# Patient Record
Sex: Female | Born: 1950 | Race: White | Hispanic: No | Smoking: Never smoker
Health system: Southern US, Community
[De-identification: ages and names within clinical notes are randomized; demographics above are authoritative.]

## PROBLEM LIST (undated history)

## (undated) DIAGNOSIS — G40909 Epilepsy, unspecified, not intractable, without status epilepticus: Secondary | ICD-10-CM

## (undated) DIAGNOSIS — G8929 Other chronic pain: Secondary | ICD-10-CM

## (undated) DIAGNOSIS — J45909 Unspecified asthma, uncomplicated: Secondary | ICD-10-CM

## (undated) DIAGNOSIS — F32A Depression, unspecified: Secondary | ICD-10-CM

## (undated) DIAGNOSIS — M792 Neuralgia and neuritis, unspecified: Secondary | ICD-10-CM

## (undated) DIAGNOSIS — F329 Major depressive disorder, single episode, unspecified: Secondary | ICD-10-CM

## (undated) HISTORY — DX: Unspecified asthma, uncomplicated: J45.909

## (undated) HISTORY — DX: Neuralgia and neuritis, unspecified: M79.2

## (undated) HISTORY — DX: Epilepsy, unspecified, not intractable, without status epilepticus: G40.909

## (undated) HISTORY — DX: Depression, unspecified: F32.A

## (undated) HISTORY — DX: Other chronic pain: G89.29

## (undated) HISTORY — PX: COLONOSCOPY: SHX174

## (undated) HISTORY — DX: Major depressive disorder, single episode, unspecified: F32.9

---

## 1980-07-28 HISTORY — PX: ECTOPIC PREGNANCY SURGERY: SHX613

## 2000-09-30 ENCOUNTER — Other Ambulatory Visit: Admission: RE | Admit: 2000-09-30 | Discharge: 2000-09-30 | Payer: Self-pay | Admitting: Obstetrics & Gynecology

## 2001-11-11 ENCOUNTER — Other Ambulatory Visit: Admission: RE | Admit: 2001-11-11 | Discharge: 2001-11-11 | Payer: Self-pay | Admitting: Obstetrics & Gynecology

## 2002-11-17 ENCOUNTER — Other Ambulatory Visit: Admission: RE | Admit: 2002-11-17 | Discharge: 2002-11-17 | Payer: Self-pay | Admitting: Obstetrics & Gynecology

## 2003-11-27 ENCOUNTER — Other Ambulatory Visit: Admission: RE | Admit: 2003-11-27 | Discharge: 2003-11-27 | Payer: Self-pay | Admitting: Obstetrics & Gynecology

## 2005-01-07 ENCOUNTER — Other Ambulatory Visit: Admission: RE | Admit: 2005-01-07 | Discharge: 2005-01-07 | Payer: Self-pay | Admitting: Obstetrics & Gynecology

## 2010-12-27 ENCOUNTER — Other Ambulatory Visit: Payer: Self-pay | Admitting: Diagnostic Neuroimaging

## 2010-12-27 DIAGNOSIS — R42 Dizziness and giddiness: Secondary | ICD-10-CM

## 2011-01-16 ENCOUNTER — Ambulatory Visit
Admission: RE | Admit: 2011-01-16 | Discharge: 2011-01-16 | Disposition: A | Discharge status: Home / Self Care | Payer: BC Managed Care – PPO | Source: Ambulatory Visit | Attending: Diagnostic Neuroimaging | Admitting: Diagnostic Neuroimaging

## 2011-01-16 DIAGNOSIS — R42 Dizziness and giddiness: Secondary | ICD-10-CM

## 2011-01-16 MED ORDER — GADOBENATE DIMEGLUMINE 529 MG/ML IV SOLN
14.0000 mL | Freq: Once | INTRAVENOUS | Status: AC | PRN
  Administered 2011-01-16: 14 mL via INTRAVENOUS

## 2013-09-26 ENCOUNTER — Encounter: Payer: Self-pay | Admitting: Diagnostic Neuroimaging

## 2013-09-26 ENCOUNTER — Encounter (INDEPENDENT_AMBULATORY_CARE_PROVIDER_SITE_OTHER): Payer: Self-pay

## 2013-09-26 ENCOUNTER — Ambulatory Visit (INDEPENDENT_AMBULATORY_CARE_PROVIDER_SITE_OTHER): Payer: BC Managed Care – PPO | Admitting: Diagnostic Neuroimaging

## 2013-09-26 VITALS — BP 111/72 | HR 74 | Ht 66.5 in | Wt 154.0 lb

## 2013-09-26 DIAGNOSIS — M5481 Occipital neuralgia: Secondary | ICD-10-CM

## 2013-09-26 DIAGNOSIS — M531 Cervicobrachial syndrome: Secondary | ICD-10-CM

## 2013-09-26 NOTE — Patient Instructions (Addendum)
  Occipital Neuralgia Neuralgias are attacks of sharp stabbing pain. They may be intermittent (comes and goes) or constant in nature. They may be brief attacks that last seconds to minutes and may come back for days to weeks.  TYPES OF NEURALGIA  When these pains are located in the back of the head and neck they are called occipital neuralgias. The attacks of pain may come from injury or inflammation (irritation) to a nerve. Often the cause is unknown. The episodes of pain may be caused by light touch, movement, or even eating and sneezing. Usually these neuralgias occur after age 96forty. The neuralgias following shingles and trigeminal neuralgia are the most common. Although painful, these episodes do not threaten life and tend to lessen as we grow older. TREATMENT  There are many medications that may be helpful in the treatment of this disorder. Sometimes several medications may have to be tried before the right combination can be found for you. Some of these medications are:  Only take over-the-counter or prescription medications for pain, discomfort, or fever as directed by your caregiver.  Antidepressants and medications used in epilepsy (seizure disorders) may be useful. LET YOUR CAREGIVER KNOW ABOUT:  If you do not obtain relief from medications.  Problems that are getting worse rather than better.  Troubling side effects that you think are coming from the medication. Do not be discouraged if you do not obtain instant relief from the medications or help given you. Your caregiver can help you get through these episodes of pain with some persistence (continued trying) on your part also. Document Released: 07/08/2001 Document Revised: 10/06/2011 Document Reviewed: 07/14/2005 [facility] Patient Information 2014 [place], [place].

## 2013-09-26 NOTE — Progress Notes (Signed)
[facility]  PATIENT: Leah Mathews DOB: 02-Jun-1951  REFERRING CLINICIAN: D Gates HISTORY FROM: patient  REASON FOR VISIT: new consult (existing patient)   HISTORICAL  CHIEF COMPLAINT:  Chief Complaint  Patient presents with  . Headache    Np # 7    HISTORY OF PRESENT ILLNESS:   New HPI (09/26/13): 63 year old female here for evaluation of intermittent electrical zaps. Patient describes brief electrical charge sensation in the back of her head, mainly on the right side but sometimes on the left. Episodes last just for one second at a time. She has these events sporadically over the last 8-10 years. Several months ago these increased in frequency with more pain. These were occurring every minute at a time. Symptoms have decreased recently. Patient has tried over-the-counter medication without relief. She was also prescribed Tegretol for possible trigeminal neuralgia which did not help.  I previously saw patient in 2012 for a different problem, vertigo ringing in the ears and seizure disorder. These are improved. No further seizures. No further vertigo.  PRIOR HPI (12/16/10): 63 year old right-handed female with history of seizure disorder, anxiety, hearing impairment, shingles, here for evaluation of vertigo, tinnitus and evaluation for seizure disorder.  Age 84 years old - patient had a combination of grand mal seizures, drop attacks, myoclonic jerks, and was treated with Dilantin x 7 years.  She had no further sz once starting Dilantin.  Eventually she was taken off of Dilantin, and she has had no further seizures since then.  April 2011 - developed right-sided "TMJ" pain and problems with cherwing; was seen by a dentist specialists and treated conservatively with heat packs.  Eventually this symptom subsided. Around the same time she developed intermittent positional vertigo (room spinning) with turning her head to the right.  This was also self-limited.  Mar 2012 -  developed well ringing sensation in her ears like a "freight train", and one morning woke up with a "burst blood vessel in her left eye".  Patient reports history of hearing impairment since age 59 years old, and wearing hearing aids for the past 6 years.  REVIEW OF SYSTEMS: Full 14 system review of systems performed and notable only for depression anxiety joint pain and cramps allergies hearing loss ringing in ears spinning sensation.  ALLERGIES: Allergies  Allergen Reactions  . Amitriptyline Other (See Comments)    Depression  . Ciprofloxacin Rash  . Sulfur Rash  . Tetanus Toxoids Rash    HOME MEDICATIONS: No outpatient prescriptions prior to visit.   No facility-administered medications prior to visit.    PAST MEDICAL HISTORY: No past medical history on file.  PAST SURGICAL HISTORY: No past surgical history on file.  FAMILY HISTORY: No family history on file.  SOCIAL HISTORY:  History   Social History  . Marital Status: Legally Separated    Spouse Name: N/A    Number of Children: 2  . Years of Education: college   Occupational History  . [employer]    Social History Main Topics  . Smoking status: Never Smoker   . Smokeless tobacco: Not on file  . Alcohol Use: Yes  . Drug Use: No  . Sexual Activity: Not on file   Other Topics Concern  . Not on file   Social History Narrative  . No narrative on file     PHYSICAL EXAM  Filed Vitals:   09/26/13 1001  BP: 111/72  Pulse: 74  Height: 5' 6.5" (1.689 m)  Weight: 154 lb (69.854 kg)  Not recorded    Body mass index is 24.49 kg/(m^2).  GENERAL EXAM: Patient is in no distress; well developed, nourished and groomed; neck is supple; NO OCCIPITAL NERVE TENDERNESS.  CARDIOVASCULAR: Regular rate and rhythm, no murmurs, no carotid bruits  NEUROLOGIC: MENTAL STATUS: awake, alert, oriented to person, place and time, recent and remote memory intact, normal attention and concentration, language fluent,  comprehension intact, naming intact, fund of knowledge appropriate CRANIAL NERVE: no papilledema on fundoscopic exam, pupils equal and reactive to light, visual fields full to confrontation, extraocular muscles intact, no nystagmus, facial sensation and strength symmetric, hearing intact, palate elevates symmetrically, uvula midline, shoulder shrug symmetric, tongue midline. MOTOR: normal bulk and tone, full strength in the BUE, BLE SENSORY: normal and symmetric to light touch, pinprick, temperature, vibration and proprioception COORDINATION: finger-nose-finger, fine finger movements, heel-shin normal REFLEXES: deep tendon reflexes present and symmetric GAIT/STATION: narrow based gait; able to walk on toes, heels and tandem; romberg is negative    DIAGNOSTIC DATA (LABS, IMAGING, TESTING) - I reviewed patient records, labs, notes, testing and imaging myself where available.  No results found for this basename: WBC, HGB, HCT, MCV, PLT   No results found for this basename: na, k, cl, co2, glucose, bun, creatinine, calcium, prot, albumin, ast, alt, alkphos, bilitot, gfrnonaa, gfraa   No results found for this basename: CHOL, HDL, LDLCALC, LDLDIRECT, TRIG, CHOLHDL   No results found for this basename: HGBA1C   No results found for this basename: VITAMINB12   No results found for this basename: TSH    01/16/11 MRI brain - few non-specific foci of gliosis  01/16/11 MRA head - normal   ASSESSMENT AND PLAN  63 y.o. year old female here with intermittent electrical sensations in the occipital region, consistent with occipital neuralgia. Discussed options for treatment. Patient will monitor symptoms over time. In future may consider occipital nerve block injection versus gabapentin.  Dx: occipital neuralgia  PLAN: - observation as symptoms spontaneously improved; in future may consider occipital nerve block injection versus gabapentin   Meds ordered this encounter  Medications  .  cholecalciferol (VITAMIN D) 1000 UNITS tablet    Sig: Take 1,000 Units by mouth daily.    Return if symptoms worsen or fail to improve.    Suanne MarkerVIKRAM R. Conal Shetley, MD 09/26/2013, 10:53 AM Certified in Neurology, Neurophysiology and Neuroimaging  [facility] [address], [address] [place], [place] [id number] [phone]

## 2014-05-10 ENCOUNTER — Encounter: Payer: Self-pay | Admitting: Diagnostic Neuroimaging

## 2014-05-10 ENCOUNTER — Ambulatory Visit (INDEPENDENT_AMBULATORY_CARE_PROVIDER_SITE_OTHER): Payer: BC Managed Care – PPO | Admitting: Diagnostic Neuroimaging

## 2014-05-10 ENCOUNTER — Encounter (INDEPENDENT_AMBULATORY_CARE_PROVIDER_SITE_OTHER): Payer: Self-pay

## 2014-05-10 VITALS — BP 129/78 | HR 59 | Ht 65.5 in | Wt 153.6 lb

## 2014-05-10 DIAGNOSIS — M5441 Lumbago with sciatica, right side: Secondary | ICD-10-CM

## 2014-05-10 DIAGNOSIS — M79604 Pain in right leg: Secondary | ICD-10-CM

## 2014-05-10 DIAGNOSIS — G2581 Restless legs syndrome: Secondary | ICD-10-CM

## 2014-05-10 DIAGNOSIS — M79605 Pain in left leg: Secondary | ICD-10-CM

## 2014-05-10 NOTE — Progress Notes (Signed)
[facility]  PATIENT: Leah Mathews DOB: 03/19/1951  REFERRING CLINICIAN: D Gates HISTORY FROM: patient  REASON FOR VISIT: new consult (existing patient)   HISTORICAL  CHIEF COMPLAINT:  Chief Complaint  Patient presents with  . Pain    legs    HISTORY OF PRESENT ILLNESS:   NEW HPI (05/10/14): Patient known to me from prior evaluations. However patient presents for new problem consisting of bilateral leg pain, restless legs, numbness. Approximately one year ago patient with onset of cramps, pain, uneasy sensation in the legs. More often she has right hip, right leg, right calf radiating pain, as compared to the left side. Over the past 1 month patient is noted intermittent jerking, uneasy restless sensation in the legs, especially she lays down to sleep at night. Sometimes this wakes her up from sleep. Patient has been taking magnesium supplements last 2 weeks which has significantly improved the symptoms. However she still has pain in the right leg, radiating downward. Patient was evaluated with iron studies, thyroid function testing, CBC which were unremarkable. Patient was prescribed Mirapex, but she has not tried this yet as the magnesium supplement seems to help her symptoms.  PRIOR HPI (09/26/13): 63 year old female here for evaluation of intermittent electrical zaps. Patient describes brief electrical charge sensation in the back of her head, mainly on the right side but sometimes on the left. Episodes last just for one second at a time. She has these events sporadically over the last 8-10 years. Several months ago these increased in frequency with more pain. These were occurring every minute at a time. Symptoms have decreased recently. Patient has tried over-the-counter medication without relief. She was also prescribed Tegretol for possible trigeminal neuralgia which did not help. I previously saw patient in 2012 for a different problem, vertigo ringing in the ears  and seizure disorder. These are improved. No further seizures. No further vertigo.  PRIOR HPI (12/16/10): 63 year old right-handed female with history of seizure disorder, anxiety, hearing impairment, shingles, here for evaluation of vertigo, tinnitus and evaluation for seizure disorder. Age 55 years old - patient had a combination of grand mal seizures, drop attacks, myoclonic jerks, and was treated with Dilantin x 7 years.  She had no further sz once starting Dilantin.  Eventually she was taken off of Dilantin, and she has had no further seizures since then. April 2011 - developed right-sided "TMJ" pain and problems with cherwing; was seen by a dentist specialists and treated conservatively with heat packs.  Eventually this symptom subsided. Around the same time she developed intermittent positional vertigo (room spinning) with turning her head to the right.  This was also self-limited. Mar 2012 - developed well ringing sensation in her ears like a "freight train", and one morning woke up with a "burst blood vessel in her left eye". Patient reports history of hearing impairment since age 19 years old, and wearing hearing aids for the past 6 years.  REVIEW OF SYSTEMS: Full 14 system review of systems performed and notable only for fatigue hearing loss ringing in ears spinning sensation joint pain cramps memory loss restless legs not enough sleep decreased energy disinterest in activities.   ALLERGIES: Allergies  Allergen Reactions  . Amitriptyline Other (See Comments)    Depression  . Ciprofloxacin Rash  . Sulfur Rash  . Tetanus Toxoids Rash    HOME MEDICATIONS: Outpatient Prescriptions Prior to Visit  Medication Sig Dispense Refill  . cholecalciferol (VITAMIN D) 1000 UNITS tablet Take 1,000 Units by mouth daily.  No facility-administered medications prior to visit.    PAST MEDICAL HISTORY: Past Medical History  Diagnosis Date  . Epilepsy   . Depression     PAST SURGICAL  HISTORY: History reviewed. No pertinent past surgical history.  FAMILY HISTORY: Family History  Problem Relation Age of Onset  . Congestive Heart Failure Mother   . Heart disease Father     SOCIAL HISTORY:  History   Social History  . Marital Status: Legally Separated    Spouse Name: N/A    Number of Children: 2  . Years of Education: college   Occupational History  . [employer]    Social History Main Topics  . Smoking status: Never Smoker   . Smokeless tobacco: Never Used  . Alcohol Use: Yes     Comment: occasionally  . Drug Use: No  . Sexual Activity: Not on file   Other Topics Concern  . Not on file   Social History Narrative   Patient lives at home with son.   Caffeine Use: 2 cups daily     PHYSICAL EXAM  Filed Vitals:   05/10/14 0918  BP: 129/78  Pulse: 59  Height: 5' 5.5" (1.664 m)  Weight: 153 lb 9.6 oz (69.673 kg)    Not recorded    Body mass index is 25.16 kg/(m^2).  GENERAL EXAM: Patient is in no distress; well developed, nourished and groomed; neck is supple  CARDIOVASCULAR: Regular rate and rhythm, no murmurs, no carotid bruits  NEUROLOGIC: MENTAL STATUS: awake, alert, oriented to person, place and time, recent and remote memory intact, normal attention and concentration, language fluent, comprehension intact, naming intact, fund of knowledge appropriate CRANIAL NERVE: no papilledema on fundoscopic exam, pupils equal and reactive to light, visual fields full to confrontation, extraocular muscles intact, no nystagmus, facial sensation and strength symmetric, hearing intact, palate elevates symmetrically, uvula midline, shoulder shrug symmetric, tongue midline. MOTOR: normal bulk and tone, full strength in the BUE, BLE SENSORY: normal and symmetric to light touch, pinprick; SLIGHT DECR TEMP IN LEFT FOOT; RIGHT TOE VIB 5 SEC; LEFT TOE VIB 9 SEC COORDINATION: finger-nose-finger, fine finger movements normal REFLEXES: BUE 1, BLE 0 GAIT/STATION:  narrow based gait; able to walk on toes, heels; romberg is negative    DIAGNOSTIC DATA (LABS, IMAGING, TESTING) - I reviewed patient records, labs, notes, testing and imaging myself where available.  No results found for this basename: WBC,  HGB,  HCT,  MCV,  PLT   No results found for this basename: na,  k,  cl,  co2,  glucose,  bun,  creatinine,  calcium,  prot,  albumin,  ast,  alt,  alkphos,  bilitot,  gfrnonaa,  gfraa   No results found for this basename: CHOL,  HDL,  LDLCALC,  LDLDIRECT,  TRIG,  CHOLHDL   No results found for this basename: HGBA1C   No results found for this basename: VITAMINB12   No results found for this basename: TSH    01/16/11 MRI brain - few non-specific foci of gliosis  01/16/11 MRA head - normal     ASSESSMENT AND PLAN  63 y.o. year old female here with a one-year history of lower extremity pain, cramps, restless movements, low back pain radiating to the right leg. Likely represents RLS, but could be related to underlying radiculopathy or neuropathy.  DDx: lumbar radiculopathy vs peripheral neuropathy vs idiopathic restless leg syndrome  PLAN: - observation, as symptoms are slightly improving with magnesium supplements - If symptoms persist or worsen,  then will check addl testing (MRI lumbar spine ,EMG/NCS, B12, A1c, CK, aldolase, ESR, CRP)  Return in about 3 months (around 08/10/2014).    Penni Bombard, MD 50/38/8828, 0:03 AM Certified in Neurology, Neurophysiology and Neuroimaging  [facility] [address] [place], [place] [address] [phone]

## 2014-05-10 NOTE — Patient Instructions (Signed)
Continue magnesium.  Monitor symptoms.  If symptoms are worse, then we may order additional testing.

## 2014-07-26 ENCOUNTER — Ambulatory Visit (INDEPENDENT_AMBULATORY_CARE_PROVIDER_SITE_OTHER): Payer: BC Managed Care – PPO | Admitting: Diagnostic Neuroimaging

## 2014-07-26 ENCOUNTER — Ambulatory Visit: Payer: BC Managed Care – PPO | Admitting: Diagnostic Neuroimaging

## 2014-07-26 ENCOUNTER — Encounter: Payer: Self-pay | Admitting: Diagnostic Neuroimaging

## 2014-07-26 VITALS — BP 130/81 | HR 84 | Ht 64.5 in | Wt 156.8 lb

## 2014-07-26 DIAGNOSIS — M5441 Lumbago with sciatica, right side: Secondary | ICD-10-CM

## 2014-07-26 NOTE — Progress Notes (Signed)
[facility]  PATIENT: Leah Mathews DOB: 02-08-1951  REFERRING CLINICIAN: D Gates HISTORY FROM: patient  REASON FOR VISIT: follow up    HISTORICAL  CHIEF COMPLAINT:  Chief Complaint  Patient presents with  . Follow-up    HISTORY OF PRESENT ILLNESS:   UPDATE 07/26/14: Since last visit, continues to have intermittent pain in bilateral buttocks, radiating to the legs (back of thighs into calves). Symptoms worse with sitting for long time. Some numbness at night time in hands. Has been less active lately. No weakness.   NEW HPI (05/10/14): Patient known to me from prior evaluations. However patient presents for new problem consisting of bilateral leg pain, restless legs, numbness. Approximately one year ago patient with onset of cramps, pain, uneasy sensation in the legs. More often she has right hip, right leg, right calf radiating pain, as compared to the left side. Over the past 1 month patient is noted intermittent jerking, uneasy restless sensation in the legs, especially she lays down to sleep at night. Sometimes this wakes her up from sleep. Patient has been taking magnesium supplements last 2 weeks which has significantly improved the symptoms. However she still has pain in the right leg, radiating downward. Patient was evaluated with iron studies, thyroid function testing, CBC which were unremarkable. Patient was prescribed Mirapex, but she has not tried this yet as the magnesium supplement seems to help her symptoms.  PRIOR HPI (09/26/13): 63 year old female here for evaluation of intermittent electrical zaps. Patient describes brief electrical charge sensation in the back of her head, mainly on the right side but sometimes on the left. Episodes last just for one second at a time. She has these events sporadically over the last 8-10 years. Several months ago these increased in frequency with more pain. These were occurring every minute at a time. Symptoms have  decreased recently. Patient has tried over-the-counter medication without relief. She was also prescribed Tegretol for possible trigeminal neuralgia which did not help. I previously saw patient in 2012 for a different problem, vertigo ringing in the ears and seizure disorder. These are improved. No further seizures. No further vertigo.  PRIOR HPI (12/16/10): 63 year old right-handed female with history of seizure disorder, anxiety, hearing impairment, shingles, here for evaluation of vertigo, tinnitus and evaluation for seizure disorder. Age 55 years old - patient had a combination of grand mal seizures, drop attacks, myoclonic jerks, and was treated with Dilantin x 7 years.  She had no further sz once starting Dilantin.  Eventually she was taken off of Dilantin, and she has had no further seizures since then. April 2011 - developed right-sided "TMJ" pain and problems with cherwing; was seen by a dentist specialists and treated conservatively with heat packs.  Eventually this symptom subsided. Around the same time she developed intermittent positional vertigo (room spinning) with turning her head to the right.  This was also self-limited. Mar 2012 - developed well ringing sensation in her ears like a "freight train", and one morning woke up with a "burst blood vessel in her left eye". Patient reports history of hearing impairment since age 39 years old, and wearing hearing aids for the past 6 years.   REVIEW OF SYSTEMS: Full 14 system review of systems performed and notable only for fatigue hearing loss ringing ears light sens restless legs freq waking muscle cramps decr concentration numbness memory loss dizziness joint pain.    ALLERGIES: Allergies  Allergen Reactions  . Amitriptyline Other (See Comments)    Depression  .  Ciprofloxacin Rash  . Sulfur Rash  . Tetanus Toxoids Rash    HOME MEDICATIONS: Outpatient Prescriptions Prior to Visit  Medication Sig Dispense Refill  .  acetaminophen-codeine (TYLENOL #3) 300-30 MG per tablet Take 1 tablet by mouth every 6 (six) hours as needed.    . ALPRAZolam (XANAX) 0.25 MG tablet Take 1 tablet by mouth as needed.    . Calcium Carbonate-Vit D-Min (CALCIUM 1200 PO) Take 1 tablet by mouth daily.    . cholecalciferol (VITAMIN D) 1000 UNITS tablet Take 1,000 Units by mouth daily.    . Magnesium 200 MG TABS Take 1 tablet by mouth daily.    . pramipexole (MIRAPEX) 0.125 MG tablet Take 1 tablet by mouth daily.     No facility-administered medications prior to visit.    PAST MEDICAL HISTORY: Past Medical History  Diagnosis Date  . Epilepsy   . Depression     PAST SURGICAL HISTORY: History reviewed. No pertinent past surgical history.  FAMILY HISTORY: Family History  Problem Relation Age of Onset  . Congestive Heart Failure Mother   . Heart disease Father     SOCIAL HISTORY:  History   Social History  . Marital Status: Legally Separated    Spouse Name: N/A    Number of Children: 2  . Years of Education: college   Occupational History  . [employer]    Social History Main Topics  . Smoking status: Never Smoker   . Smokeless tobacco: Never Used  . Alcohol Use: Yes     Comment: occasionally  . Drug Use: No  . Sexual Activity: Not on file   Other Topics Concern  . Not on file   Social History Narrative   Patient lives at home with son.   Caffeine Use: 2 cups daily     PHYSICAL EXAM  Filed Vitals:   07/26/14 1538  BP: 130/81  Pulse: 84  Height: 5' 4.5" (1.638 m)  Weight: 156 lb 12.8 oz (71.124 kg)    Not recorded      Body mass index is 26.51 kg/(m^2).  GENERAL EXAM: Patient is in no distress; well developed, nourished and groomed; neck is supple  CARDIOVASCULAR: Regular rate and rhythm, no murmurs, no carotid bruits  NEUROLOGIC: MENTAL STATUS: awake, alert, language fluent, comprehension intact, naming intact, fund of knowledge appropriate CRANIAL NERVE: pupils equal and reactive to  light, visual fields full to confrontation, extraocular muscles intact, no nystagmus, facial sensation and strength symmetric, hearing intact, palate elevates symmetrically, uvula midline, shoulder shrug symmetric, tongue midline. MOTOR: normal bulk and tone, full strength in the BUE, BLE SENSORY: normal and symmetric to light touch; STRAIGHT LEG RAISE NEG; DF OF FEET TRIGGERS PAIN IN CALVES AND HAMSTRINGS COORDINATION: finger-nose-finger, fine finger movements normal REFLEXES: BUE 1, BLE TRACE  GAIT/STATION: narrow based gait; romberg is negative    DIAGNOSTIC DATA (LABS, IMAGING, TESTING) - I reviewed patient records, labs, notes, testing and imaging myself where available.  No results found for: WBC No results found for: NA No results found for: CHOL No results found for: HGBA1C No results found for: VITAMINB12 No results found for: TSH  01/16/11 MRI brain - few non-specific foci of gliosis  01/16/11 MRA head - normal     ASSESSMENT AND PLAN  63 y.o. year old female here with a intermittent lower extremity pain, cramps, restless movements, low back pain radiating to the right leg. May represent lumbar radiculopathy or neuropathy. Since symptoms are mild and intermittent, advised conservative management.  DDx: lumbar radiculopathy vs peripheral neuropathy vs idiopathic restless leg syndrome  PLAN: - PT evaluation - consider massage therapy, yoga, water therapy/swimming - If symptoms persist or worsen, then may consider addl testing (MRI lumbar spine, EMG/NCS, B12, A1c, CK, aldolase, ESR, CRP)  Return in about 4 months (around 11/25/2014).    Penni Bombard, MD 73/40/3709, 6:43 PM Certified in Neurology, Neurophysiology and Neuroimaging  [facility] [address], [place], [place] [address] [phone]

## 2014-07-26 NOTE — Patient Instructions (Signed)
-   physical therapy evaluation - consider massage therapy, yoga, water therapy/swimming

## 2014-08-14 ENCOUNTER — Ambulatory Visit: Payer: BC Managed Care – PPO | Admitting: Diagnostic Neuroimaging

## 2014-08-21 ENCOUNTER — Ambulatory Visit: Payer: BC Managed Care – PPO | Admitting: Occupational Therapy

## 2014-08-21 ENCOUNTER — Ambulatory Visit: Payer: BC Managed Care – PPO | Admitting: Physical Therapy

## 2014-08-31 ENCOUNTER — Ambulatory Visit: Payer: BC Managed Care – PPO | Attending: Diagnostic Neuroimaging

## 2014-08-31 DIAGNOSIS — M791 Myalgia: Secondary | ICD-10-CM | POA: Diagnosis present

## 2014-08-31 DIAGNOSIS — M5441 Lumbago with sciatica, right side: Secondary | ICD-10-CM | POA: Diagnosis not present

## 2014-08-31 DIAGNOSIS — M7918 Myalgia, other site: Secondary | ICD-10-CM

## 2014-08-31 DIAGNOSIS — M5430 Sciatica, unspecified side: Secondary | ICD-10-CM

## 2014-08-31 DIAGNOSIS — G40909 Epilepsy, unspecified, not intractable, without status epilepticus: Secondary | ICD-10-CM | POA: Insufficient documentation

## 2014-08-31 NOTE — Therapy (Signed)
[facility] [address] [place], [place], [address] Phone: [phone]   Fax:  [phone]  Physical Therapy Evaluation  Patient Details  Name: Leah Mathews MRN: [id number] Date of Birth: 15-Jan-1951 Referring Provider:  Hollice Espy, MD  Encounter Date: 08/31/2014      PT End of Session - 08/31/14 1155    Visit Number 1   Number of Visits 17   Date for PT Re-Evaluation 10/30/14   Authorization Type BCBS   PT Start Time [phone]   PT Stop Time 1013   PT Time Calculation (min) 42 min   Activity Tolerance Patient tolerated treatment well   Behavior During Therapy [facility] for tasks assessed/performed      Past Medical History  Diagnosis Date  . Epilepsy   . Depression     History reviewed. No pertinent past surgical history.  There were no vitals taken for this visit.  Visit Diagnosis:  Bilateral buttock pain - Plan: PT plan of care cert/re-cert  Sciatic leg pain - B LE pain - Plan: PT plan of care cert/re-cert      Subjective Assessment - 08/31/14 0940    Symptoms B buttocks and LE pain, difficulty lifting legs after sitting/lying down, N/T in B LEs, pain occasionally wakes pt while sleeping at night however, she is unsure if this is due to restless leg syndrome   Pertinent History Self reported osteoporosis, chronic B knee pain, seizures   Limitations Sitting;Standing   How long can you sit comfortably? One hour   How long can you stand comfortably? 30 minutes   Patient Stated Goals Sleep through the night without pain/tossing and turning, reduce pain while sitting, dance without pain, more energy in order to clean house   Currently in Pain? No/denies  Pt reported pain in B buttocks/LEs can incr. to 7-8/10 at its worse and described it as achy and dull with occasional sharp/electrical sensation in B LEs (R worse than L).          [facility] PT Assessment - 08/31/14 0946    Assessment   Medical Diagnosis Low  back pain with R sided leg pain   Onset Date 07/28/13   Prior Therapy PT for low back, approx. 30 years ago.   Precautions   Precautions None   Restrictions   Weight Bearing Restrictions No   Balance Screen   Has the patient fallen in the past 6 months No   Has the patient had a decrease in activity level because of a fear of falling?  No  However, pt is fearful of falling on steps while walking dog   Is the patient reluctant to leave their home because of a fear of falling?  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   Coordination   Gross Motor Movements are Fluid and Coordinated Yes   Fine Motor Movements are Fluid and Coordinated Yes   Functional Tests   Functional tests --  pt reported bending/lifting can incr. pain in buttocks.   Posture/Postural Control   Posture/Postural Control Postural limitations   Postural Limitations Posterior pelvic tilt   AROM   Overall AROM   Deficits   Overall AROM Comments WFL except for decreased B hip IR.   Strength   Overall Strength Deficits   Overall Strength Comments B knee ext: 5/5, B knee flex, hip flexion, dorsiflexion: 4/5 with no reproduction of pain. B hip abduction: 3+/5.   Flexibility   Soft Tissue Assessment /Muscle Lenght yes   Hamstrings B PROM: pt lacking approx. 5 degrees of knee extension with hips at 90 degrees while in supine.   Piriformis Decreased flexibility in B piriformis.   Palpation   Palpation Tenderness and reproduction of pain with palpation of B proximal hamstring attachment (at ischial tuberosities) and along entire B piriformis.   Special Tests    Special Tests Hip Special Tests;Lumbar   Hip Special Tests  Hip Scouring   Straight Leg Raise   Findings Negative   Side  Left   Comment Passive SLR negative, Active left SLR reproduced symptoms in R hip indicating poor load transfer.   other   Findings Negative   Side  Left   Comments Pain not reproduced during back extension, flexion or sidebending.   Hip Scouring   Findings Negative   Side Right;Left   Transfers   Transfers Sit to Stand;Stand to Sit   Sit to Stand 6: Modified independent (Device/Increase time);With armrests;From chair/3-in-1;With upper extremity assist  increased time   Stand to Sit 6: Modified independent (Device/Increase time)   Ambulation/Gait   Ambulation/Gait Yes   Ambulation/Gait Assistance 5: Supervision   Ambulation/Gait Assistance Details Pt ambulated over even terrain.   Ambulation Distance (Feet) 100 Feet   Assistive device None   Gait Pattern Step-through pattern;Decreased trunk rotation  decreased hip rotation, pt guarded while ambulating   Ambulation Surface Level;Indoor                          PT Education - 08/31/14 1154    Education provided Yes   Education Details PT educated pt on using pillows under knees/between knees to improve comfort while sleeping. PT also educated pt  on piriformis muscle and how decreased flexibility can cause compression of sciatic nerve, which can result in B buttock and LE pain.   Person(s) Educated Patient   Methods Explanation   Comprehension Verbalized understanding          PT Short Term Goals - 08/31/14 1200    PT SHORT TERM GOAL #1   Title Pt will be independent in HEP to reduce pain, improve flexibility/strength, and to improve functional mobility. Target date: 09/28/14.   Status New   PT SHORT TERM GOAL #2   Title Pt will report decrease in B buttock/LE pain to </=5/10, while sitting for >60 minutes, in order to perform work duties. Target date: 09/28/14.   Status New   PT  SHORT TERM GOAL #3   Title Perform balance and DGI assessment and write appropriate goal. Target date: 09/28/14.   Status New   PT SHORT TERM GOAL #4   Title Pt will improve B hamstring flexibility to full knee extension with hip in 90 degree position while lying in supine, in order to decrease pain. Target date: 09/28/14.   Status New   PT SHORT TERM GOAL #5   Title Pt will report she is able to go dancing with friends without increase in B buttock/LE pain, in order to improve quality of life. Target date: 09/28/14.   Status New           PT Long Term Goals - 08/31/14 1203    PT LONG TERM GOAL #1   Title Pt will verbalize plans to join a fitness center, or take dance lessons, to maintain gains made in PT and to improve quality of life. Target date: 10/26/14.   Status New   PT LONG TERM GOAL #2   Title Pt will improve ODI score by 12 points to improve quality of life. Target date: 10/26/14.   Status New   PT LONG TERM GOAL #3   Title Pt will report decrease in pain to </=3/10 when sitting >60 minutes to perform work tasks. Target date: 10/26/14.   Status New   PT LONG TERM GOAL #4   Title Pt will be able to ascend/descend 4 steps while carrying 20 pounds, independently, with LOB in order to walk dogs safely and without a fear of falling. Target date:  10/26/14.   Status New               Plan - 08/31/14 [id number]    Clinical Impression Statement Pt is 64 y/o female presenting to OPPT neuro with B buttock pain, which radiates into B legs (R side pain worse than L side). Pain began approx. 1 year ago, and has become progressively worse. B LE pain radiates distally into posterior LE, but does not pass mid gastroc area. Pt states the pain is intermittent and is worse during prolonged sitting. Pt also has history of restless leg syndrome, which has improved with medication. Pt denied falls in the last six months, however, she is fearful of falling down steps when walking her 2 dogs. P   Pt will benefit from skilled therapeutic intervention in order to improve on the following deficits Abnormal gait;Impaired flexibility;Decreased endurance;Pain;Decreased balance;Decreased mobility;Decreased strength   Rehab Potential Good   PT Frequency 2x / week   PT Duration 8 weeks   PT Treatment/Interventions ADLs/Self Care Home Management;Gait training;Neuromuscular re-education;Ultrasound;Stair training;Biofeedback;Functional mobility training;Patient/family education;Therapeutic activities;Cryotherapy;Therapeutic exercise;Manual techniques;Balance training;DME Instruction  No e-stim, as pt has history of seizures.   PT Next Visit Plan Assess balance. Provide pt with hamstring/piriformis stretching program and strengthening program. Manual therapy as needed.   Consulted and Agree with Plan of Care Patient         Problem List There are no active problems to display for this patient.   Alvie Fowles L 08/31/2014, 12:14 PM  [facility] [address] [place], [place], [address] Phone: [phone]   Fax:  [phone]    Zerita BoersJennifer Marena Witts, PT,DPT 08/31/2014 12:14 PM Phone: [phone] Fax: [phone]

## 2014-09-04 ENCOUNTER — Ambulatory Visit: Payer: BC Managed Care – PPO

## 2014-09-04 DIAGNOSIS — M7918 Myalgia, other site: Secondary | ICD-10-CM

## 2014-09-04 DIAGNOSIS — M5430 Sciatica, unspecified side: Secondary | ICD-10-CM

## 2014-09-04 DIAGNOSIS — M791 Myalgia: Secondary | ICD-10-CM | POA: Diagnosis not present

## 2014-09-04 NOTE — Therapy (Signed)
[facility] [address] [place], [place], [address] Phone: [phone]   Fax:  [phone]  Physical Therapy Treatment  Patient Details  Name: Leah Mathews MRN: [id number] Date of Birth: 12/16/1950 Referring Provider:  Hollice EspyGates, Donna Ruth, MD  Encounter Date: 09/04/2014      PT End of Session - 09/04/14 0939    Visit Number 2   Number of Visits 17   Date for PT Re-Evaluation 10/30/14   Authorization Type BCBS   PT Start Time [phone]   PT Stop Time 0932   PT Time Calculation (min) 40 min      Past Medical History  Diagnosis Date  . Epilepsy   . Depression     History reviewed. No pertinent past surgical history.  There were no vitals taken for this visit.  Visit Diagnosis:  Bilateral buttock pain  Sciatic leg pain      Subjective Assessment - 09/04/14 0854    Symptoms Pt arrived 7 minutes late to session. Pt reported low back pain on Friday and Saturday but none today.    Pertinent History Self reported osteoporosis, chronic B knee pain, seizures   Limitations Sitting;Standing   How long can you sit comfortably? One hour   How long can you stand comfortably? 30 minutes   Patient Stated Goals Sleep through the night without pain/tossing and turning, reduce pain while sitting, dance without pain, more energy in order to clean house   Currently in Pain? No/denies                    [facility] Adult PT Treatment/Exercise - 09/04/14 0855    Standardized Balance Assessment   Standardized Balance Assessment Dynamic Gait Index   Dynamic Gait Index   Level Surface Normal   Change in Gait Speed Normal   Gait with Horizontal Head Turns Mild Impairment   Gait with Vertical Head Turns Mild Impairment   Gait and Pivot Turn Normal   Step Over Obstacle Mild Impairment   Step Around Obstacles Normal   Steps Mild Impairment   Total Score 20   Exercises   Exercises Knee/Hip;Lumbar   Lumbar Exercises:  Stretches   Lower Trunk Rotation 1 rep;20 seconds   Lower Trunk Rotation Limitations B LEs, pt reported groin pain during stretch, so exercise ceased.   Knee/Hip Exercises: Stretches   Active Hamstring Stretch 3 reps;30 seconds   Active Hamstring Stretch Limitations B LE, with cues for technique. Pt performed with heel on floor and on chair, she reported heel on floor being more comfortable. Each stretch performed 3 reps each.   Piriformis Stretch 3 reps;30 seconds   Piriformis Stretch Limitations B LE; pt performed knee to contralat. shoulder stretch and ER (ankle to contralat. knee with hip flexion) piriformis stretch. Pt reported she felt a "better" stretch with knee to shoulder stretch. VC's for technique. Each stretch perform 3 reps each.                PT Education - 09/04/14 [id number]    Education provided Yes   Education Details Stretching HEP, PT also explained the importance of obtaining full ROM in order for strength training to be effective.   Person(s) Educated Patient   Methods Explanation;Demonstration;Verbal cues;Tactile cues;Handout   Comprehension Verbalized understanding;Returned demonstration          PT Short Term Goals - 09/04/14 1144    PT SHORT TERM GOAL #1   Title Pt will be independent in HEP  to reduce pain, improve flexibility/strength, and to improve functional mobility. Target date: 09/28/14.   Status On-going   PT SHORT TERM GOAL #2   Title Pt will report decrease in B buttock/LE pain to </=5/10, while sitting for >60 minutes, in order to perform work duties. Target date: 09/28/14.   Status On-going   PT SHORT TERM GOAL #3   Title Perform balance and DGI assessment and write appropriate goal. Target date: 09/28/14.   Baseline Pt scored 20/24, DGI goal not appropriate as pt not at risk for falls.   Status Achieved   PT SHORT TERM GOAL #4   Title Pt will improve B hamstring flexibility to full knee extension with hip in 90 degree position while lying in  supine, in order to decrease pain. Target date: 09/28/14.   Status On-going   PT SHORT TERM GOAL #5   Title Pt will report she is able to go dancing with friends without increase in B buttock/LE pain, in order to improve quality of life. Target date: 09/28/14.   Status On-going           PT Long Term Goals - 09/04/14 1146    PT LONG TERM GOAL #1   Title Pt will verbalize plans to join a fitness center, or take dance lessons, to maintain gains made in PT and to improve quality of life. Target date: 10/26/14.   Status On-going   PT LONG TERM GOAL #2   Title Pt will improve ODI score by 12 points to improve quality of life. Target date: 10/26/14.   Status On-going   PT LONG TERM GOAL #3   Title Pt will report decrease in pain to </=3/10 when sitting >60 minutes to perform work tasks. Target date: 10/26/14.   Status On-going   PT LONG TERM GOAL #4   Title Pt will be able to ascend/descend 4 steps while carrying 20 pounds, independently, with LOB in order to walk dogs safely and without a fear of falling. Target date: 10/26/14.   Status On-going               Plan - 09/04/14 4782    Clinical Impression Statement Pt scored 20/24 on DGI, indicating she is a low risk for falls. However, pt did experience LOB during head turns (self corrected by stepping strategy), and required increased time to step over obstacle and required handrails on steps. I believe this is due to increased hip adduction while descending stairs and narrowed BOS during head turns. MMT B hip abductors: 3/5 and B hip extensors: 3+/5, and B hip external rotators: 3+/5. Step-down: increased hip adduction and increased hip instability noted. Pt would continue to benefit from skilled PT to improve strength, flexibility, and safety during functional mobility.   Pt will benefit from skilled therapeutic intervention in order to improve on the following deficits Abnormal gait;Impaired flexibility;Decreased endurance;Pain;Decreased  balance;Decreased mobility;Decreased strength   Rehab Potential Good   PT Frequency 2x / week   PT Duration 8 weeks   PT Treatment/Interventions ADLs/Self Care Home Management;Gait training;Neuromuscular re-education;Ultrasound;Stair training;Biofeedback;Functional mobility training;Patient/family education;Therapeutic activities;Cryotherapy;Therapeutic exercise;Manual techniques;Balance training;DME Instruction  No e-stim; history of seizures.   PT Next Visit Plan Sidelying clamshells and bridges with theraband around knees to promote hip abductor activation. Review HEP. Pelvic tilts to decr. Posterior pelvic tilt. And manual therapy as needed.   Consulted and Agree with Plan of Care Patient        Problem List There are no active problems to display for  this patient.   Blythe Veach L 09/04/2014, 11:47 AM  [facility] [address] [place], [place], [address] Phone: [phone]   Fax:  [phone]     Zerita Boers, PT,DPT 09/04/2014 11:47 AM Phone: [phone] Fax: [phone]

## 2014-09-04 NOTE — Patient Instructions (Signed)
Chair Sitting   Sit at edge of seat, spine straight, one leg extended. Bend forward from the hip, keeping spine straight. Support upper body with arms. Hold 30___ seconds. Repeat _3__ times per session, with each leg. Do _3__ sessions per day.  Copyright  VHI. All rights reserved.  Piriformis Stretch - Supine   Pull knee across body toward opposite shoulder. Hold slight stretch for _30__ seconds. Repeat with other leg. Repeat _3__ times, with each leg. Do __3_ times per day.  Copyright  VHI. All rights reserved.

## 2014-09-06 ENCOUNTER — Ambulatory Visit: Payer: BC Managed Care – PPO

## 2014-09-06 DIAGNOSIS — M791 Myalgia: Secondary | ICD-10-CM | POA: Diagnosis not present

## 2014-09-06 DIAGNOSIS — M7918 Myalgia, other site: Secondary | ICD-10-CM

## 2014-09-06 DIAGNOSIS — M5430 Sciatica, unspecified side: Secondary | ICD-10-CM

## 2014-09-06 NOTE — Patient Instructions (Signed)
Piriformis Stretch, Supine   Lie supine, one ankle crossed onto opposite knee. Holding bottom leg behind knee, gently pull legs toward chest until stretch is felt in buttock of top leg. Hold _20-30__ seconds. For deeper stretch gently push top knee away from body.  Repeat _3__ times per session. Do __2-3_ sessions per day.  Copyright  VHI. All rights reserved.

## 2014-09-06 NOTE — Therapy (Signed)
[facility] [address] [place], [place], [address] Phone: [phone]   Fax:  [phone]  Physical Therapy Treatment  Patient Details  Name: Leah Mathews MRN: [id number] Date of Birth: 07-05-1951 Referring Provider:  Hollice Espy, MD  Encounter Date: 09/06/2014      PT End of Session - 09/06/14 1238    Visit Number 3   Number of Visits 17   Date for PT Re-Evaluation 10/30/14   Authorization Type BCBS   PT Start Time 0850   PT Stop Time 0928   PT Time Calculation (min) 38 min   Activity Tolerance Patient tolerated treatment well   Behavior During Therapy [facility] for tasks assessed/performed      Past Medical History  Diagnosis Date  . Epilepsy   . Depression     History reviewed. No pertinent past surgical history.  There were no vitals taken for this visit.  Visit Diagnosis:  Bilateral buttock pain  Sciatic leg pain      Subjective Assessment - 09/06/14 0852    Symptoms Pt reported she woke up with B LE/knee pain this morning at 5am, and it decreased to 0/10 with Motrin after approx. 1 hour.  Pt denied falls since last visit.   Pertinent History Self reported osteoporosis, chronic B knee pain, seizures   Limitations Sitting;Standing   How long can you sit comfortably? One hour   How long can you stand comfortably? 30 minutes   Patient Stated Goals Sleep through the night without pain/tossing and turning, reduce pain while sitting, dance without pain, more energy in order to clean house   Currently in Pain? No/denies      Manual therapy: PT performed massage to B glut med/max and piriformis muscles. PT also performed ischemic compression to release trigger points in piriformis and glut med. Pt reported decreased in tension after manual therapy.  PT performed B supine piriformis and hamstring stretches; 2x30 second holds.                       PT Education - 09/06/14 1236    Education provided Yes   Education Details Reviewed stretching HEP and provided pt with additional piriformis stretch, as pt reported she was "got a better stretch with ankle crossed over knee now". PT encouraged pt to increase activity (walking), starting in increments of 5 minutes, and increasing as tolerated.  PT also educated pt that movement is not bad for pt, as she is fearful that movement will increase pain. PT educated pt on log rolling to decrease strain on low back.   Person(s) Educated Patient   Methods Explanation;Demonstration;Handout   Comprehension Verbalized understanding;Returned demonstration;Need further instruction          PT Short Term Goals - 09/04/14 1144    PT SHORT TERM GOAL #1   Title Pt will be independent in HEP to reduce pain, improve flexibility/strength, and to improve functional mobility. Target date: 09/28/14.   Status On-going   PT SHORT TERM GOAL #2   Title Pt will report decrease in B buttock/LE pain to </=5/10, while sitting for >60 minutes, in order to perform work duties. Target date: 09/28/14.   Status On-going   PT SHORT TERM GOAL #3   Title Perform balance and DGI assessment and write appropriate goal. Target date: 09/28/14.   Baseline Pt scored 20/24, DGI goal not appropriate as pt not at risk for falls.   Status Achieved   PT SHORT  TERM GOAL #4   Title Pt will improve B hamstring flexibility to full knee extension with hip in 90 degree position while lying in supine, in order to decrease pain. Target date: 09/28/14.   Status On-going   PT SHORT TERM GOAL #5   Title Pt will report she is able to go dancing with friends without increase in B buttock/LE pain, in order to improve quality of life. Target date: 09/28/14.   Status On-going           PT Long Term Goals - 09/04/14 1146    PT LONG TERM GOAL #1   Title Pt will verbalize plans to join a fitness center, or take dance lessons, to maintain gains made in PT and to improve quality of life. Target  date: 10/26/14.   Status On-going   PT LONG TERM GOAL #2   Title Pt will improve ODI score by 12 points to improve quality of life. Target date: 10/26/14.   Status On-going   PT LONG TERM GOAL #3   Title Pt will report decrease in pain to </=3/10 when sitting >60 minutes to perform work tasks. Target date: 10/26/14.   Status On-going   PT LONG TERM GOAL #4   Title Pt will be able to ascend/descend 4 steps while carrying 20 pounds, independently, with LOB in order to walk dogs safely and without a fear of falling. Target date: 10/26/14.   Status On-going               Plan - 09/06/14 1238    Clinical Impression Statement Pt continues to experience B hip/LE pain, and is fearful that movement increases pain. Pt reported decrease tension in B hip muscles after manual therapy. Pt would continue to benefit from skilled PT to decrease pain during functional mobility.   Pt will benefit from skilled therapeutic intervention in order to improve on the following deficits Abnormal gait;Impaired flexibility;Decreased endurance;Pain;Decreased balance;Decreased mobility;Decreased strength   Rehab Potential Good   PT Frequency 2x / week   PT Duration 8 weeks   PT Treatment/Interventions ADLs/Self Care Home Management;Gait training;Neuromuscular re-education;Ultrasound;Stair training;Biofeedback;Functional mobility training;Patient/family education;Therapeutic activities;Cryotherapy;Therapeutic exercise;Manual techniques;Balance training;DME Instruction  No e-stim due to history of seizures.   PT Next Visit Plan Pelvic tilts. Sidelying clamshells and bridges with theraband around knees to promote hip abductor activation. Manual therapy as needed. Perform FGA to assess dynamic gait.   Consulted and Agree with Plan of Care Patient        Problem List There are no active problems to display for this patient.   Rena Hunke L 09/06/2014, 12:43 PM  [facility] [address] [place], [place], [address] Phone: [phone]   Fax:  [phone]     Zerita BoersJennifer Kadrian Partch, PT,DPT 09/06/2014 12:44 PM Phone: [phone] Fax: [phone]

## 2014-09-11 ENCOUNTER — Ambulatory Visit: Payer: BC Managed Care – PPO

## 2014-09-13 ENCOUNTER — Ambulatory Visit: Payer: BC Managed Care – PPO | Admitting: Physical Therapy

## 2014-09-13 ENCOUNTER — Encounter: Payer: Self-pay | Admitting: Physical Therapy

## 2014-09-13 DIAGNOSIS — M791 Myalgia: Secondary | ICD-10-CM | POA: Diagnosis not present

## 2014-09-13 DIAGNOSIS — M7918 Myalgia, other site: Secondary | ICD-10-CM

## 2014-09-13 DIAGNOSIS — M5430 Sciatica, unspecified side: Secondary | ICD-10-CM

## 2014-09-13 NOTE — Patient Instructions (Addendum)
PELVIC STABILIZATION: Pelvic Tilt (Lying)   Exhaling, pull belly toward spine, tilting pelvis forward. Inhaling, release. Repeat 10 times. Do 2 times per day.  Copyright  VHI. All rights reserved.  PELVIC STABILIZATION: Basic Bridge   Exhaling, lift hips. Hold for 5 breaths. Exhaling, release hips back to floor. Repeat 10 times. Do 2 times per day.  Copyright  VHI. All rights reserved.  Abduction: Clam (Eccentric) - Side-Lying   Lie on side with knees bent. Lift top knee, keeping feet together. Keep trunk steady. Slowly lower for 3-5 seconds. 10 reps per set, 2 sets per day.  Copyright  VHI. All rights reserved.

## 2014-09-13 NOTE — Therapy (Signed)
[facility] [address] [place], [place], [address] Phone: [phone]   Fax:  [phone]  Physical Therapy Treatment  Patient Details  Name: Leah Mathews MRN: [id number] Date of Birth: [birth year] Referring Provider:  Hollice Espy, MD  Encounter Date: 09/13/2014      PT End of Session - 09/13/14 1056    Visit Number 4   Number of Visits 17   Date for PT Re-Evaluation 10/30/14   Authorization Type BCBS   PT Start Time 0848   PT Stop Time 0932   PT Time Calculation (min) 44 min   Activity Tolerance Patient tolerated treatment well   Behavior During Therapy [facility] for tasks assessed/performed      Past Medical History  Diagnosis Date  . Epilepsy   . Depression     History reviewed. No pertinent past surgical history.  There were no vitals taken for this visit.  Visit Diagnosis:  Bilateral buttock pain  Sciatic leg pain      Subjective Assessment - 09/13/14 0851    Symptoms "Can the exercises cause pain?"  Pt reports slip on ice but no fall.   Currently in Pain? Yes   Pain Score 5    Pain Location Back   Pain Orientation Right;Lower   Pain Descriptors / Indicators Dull   Pain Type Chronic pain   Pain Onset More than a month ago   Pain Frequency Constant  "woke up with it"   Pain Relieving Factors tylenol helps sometimes   Multiple Pain Sites No          [facility] PT Assessment - 09/13/14 1048    Functional Gait  Assessment   Gait assessed  Yes   Gait Level Surface Walks 20 ft in less than 5.5 sec, no assistive devices, good speed, no evidence for imbalance, normal gait pattern, deviates no more than 6 in outside of the 12 in walkway width.   Change in Gait Speed Able to smoothly change walking speed without loss of balance or gait deviation. Deviate no more than 6 in outside of the 12 in walkway width.   Gait with Horizontal Head Turns Performs head turns smoothly with no change in gait.  Deviates no more than 6 in outside 12 in walkway width   Gait with Vertical Head Turns Performs head turns with no change in gait. Deviates no more than 6 in outside 12 in walkway width.   Gait and Pivot Turn Pivot turns safely within 3 sec and stops quickly with no loss of balance.   Step Over Obstacle Is able to step over 2 stacked shoe boxes taped together (9 in total height) without changing gait speed. No evidence of imbalance.   Gait with Narrow Base of Support Ambulates 4-7 steps.   Gait with Eyes Closed Walks 20 ft, no assistive devices, good speed, no evidence of imbalance, normal gait pattern, deviates no more than 6 in outside 12 in walkway width. Ambulates 20 ft in less than 7 sec.   Ambulating Backwards Walks 20 ft, no assistive devices, good speed, no evidence for imbalance, normal gait   Steps Alternating feet, no rail.   Total Score 28                  [facility] Adult PT Treatment/Exercise - 09/13/14 1048    Posture/Postural Control   Posture/Postural Control Postural limitations   Postural Limitations Posterior pelvic tilt   Lumbar Exercises: Stretches   Active Hamstring Stretch  1 rep;20 seconds   Active Hamstring Stretch Limitations seated edge of mat-needed cues on degree of stretch   Single Knee to Chest Stretch 2 reps;10 seconds   Single Knee to Chest Stretch Limitations Pt c/o L hip flexor cramp with L knee to ches   Lumbar Exercises: Seated   Other Seated Lumbar Exercises pelvic tilt with PTA assisting/tactile cues for correct movement x 15   Lumbar Exercises: Supine   Bridge 10 reps;3 seconds   Bridge Limitations poor hip extension   Lumbar Exercises: Sidelying   Clam 10 reps;3 seconds   Clam Limitations Pt c/o pain with yellow theraband for resistance therefore performed without resistance                PT Education - 09/13/14 1055    Education provided Yes   Education Details Review hamstring stretch.  Added to HEP.  PT also continued to  educate that movement not bad for pt.  Discussed importance of gentle stretch with exercises and should not cause "pain"   Person(s) Educated Patient   Methods Explanation;Demonstration;Handout   Comprehension Verbalized understanding;Need further instruction          PT Short Term Goals - 09/04/14 1144    PT SHORT TERM GOAL #1   Title Pt will be independent in HEP to reduce pain, improve flexibility/strength, and to improve functional mobility. Target date: 09/28/14.   Status On-going   PT SHORT TERM GOAL #2   Title Pt will report decrease in B buttock/LE pain to </=5/10, while sitting for >60 minutes, in order to perform work duties. Target date: 09/28/14.   Status On-going   PT SHORT TERM GOAL #3   Title Perform balance and DGI assessment and write appropriate goal. Target date: 09/28/14.   Baseline Pt scored 20/24, DGI goal not appropriate as pt not at risk for falls.   Status Achieved   PT SHORT TERM GOAL #4   Title Pt will improve B hamstring flexibility to full knee extension with hip in 90 degree position while lying in supine, in order to decrease pain. Target date: 09/28/14.   Status On-going   PT SHORT TERM GOAL #5   Title Pt will report she is able to go dancing with friends without increase in B buttock/LE pain, in order to improve quality of life. Target date: 09/28/14.   Status On-going           PT Long Term Goals - 09/04/14 1146    PT LONG TERM GOAL #1   Title Pt will verbalize plans to join a fitness center, or take dance lessons, to maintain gains made in PT and to improve quality of life. Target date: 10/26/14.   Status On-going   PT LONG TERM GOAL #2   Title Pt will improve ODI score by 12 points to improve quality of life. Target date: 10/26/14.   Status On-going   PT LONG TERM GOAL #3   Title Pt will report decrease in pain to </=3/10 when sitting >60 minutes to perform work tasks. Target date: 10/26/14.   Status On-going   PT LONG TERM GOAL #4   Title Pt will be  able to ascend/descend 4 steps while carrying 20 pounds, independently, with LOB in order to walk dogs safely and without a fear of falling. Target date: 10/26/14.   Status On-going               Plan - 09/13/14 1057    Clinical Impression Statement Pt  continues with bil hip/LE pain and is very apprehensive to perform any movement that increases pain or provides stretch.  PTA able to palpate trigger point/cramp in L hip flexor that pt was c/o.   Pt will benefit from skilled therapeutic intervention in order to improve on the following deficits Abnormal gait;Impaired flexibility;Decreased endurance;Pain;Decreased balance;Decreased mobility;Decreased strength   Rehab Potential Good   PT Frequency 2x / week   PT Duration 8 weeks   PT Treatment/Interventions ADLs/Self Care Home Management;Gait training;Neuromuscular re-education;Ultrasound;Stair training;Biofeedback;Functional mobility training;Patient/family education;Therapeutic activities;Cryotherapy;Therapeutic exercise;Manual techniques;Balance training;DME Instruction   PT Next Visit Plan Review clamshells, bridges, pelvic tilt.  Perform trunk/pelvic activities with therapy ball.   Consulted and Agree with Plan of Care Patient        Problem List There are no active problems to display for this patient.   Newell CoralRobertson, Roneshia Drew Terry 09/13/2014, 11:00 AM  [facility] [address] [place], [place], [address] Phone: [phone]   Fax:  [phone]     Newell CoralDenise Terry Garfield Coiner, [place] [facility] 09/13/2014 11:00 AM Phone: [phone] Fax: [phone]

## 2014-09-20 ENCOUNTER — Ambulatory Visit: Payer: BC Managed Care – PPO | Admitting: Physical Therapy

## 2014-09-20 ENCOUNTER — Encounter: Payer: Self-pay | Admitting: Physical Therapy

## 2014-09-20 DIAGNOSIS — M5430 Sciatica, unspecified side: Secondary | ICD-10-CM

## 2014-09-20 DIAGNOSIS — M7918 Myalgia, other site: Secondary | ICD-10-CM

## 2014-09-20 DIAGNOSIS — M791 Myalgia: Secondary | ICD-10-CM | POA: Diagnosis not present

## 2014-09-20 NOTE — Therapy (Signed)
[facility] [address] [place], [place], [address] Phone: [phone]   Fax:  [phone]  Physical Therapy Treatment  Patient Details  Name: Leah Mathews MRN: [id number] Date of Birth: 07/08/1951 Referring Provider:  Hollice Espy, MD  Encounter Date: 09/20/2014      PT End of Session - 09/20/14 1339    Visit Number 5   Number of Visits 17   Date for PT Re-Evaluation 10/30/14   Authorization Type BCBS   PT Start Time 0855   PT Stop Time 0934   PT Time Calculation (min) 39 min   Activity Tolerance Patient tolerated treatment well   Behavior During Therapy [facility] for tasks assessed/performed      Past Medical History  Diagnosis Date  . Epilepsy   . Depression     History reviewed. No pertinent past surgical history.  There were no vitals taken for this visit.  Visit Diagnosis:  Bilateral buttock pain  Sciatic leg pain      Subjective Assessment - 09/20/14 0856    Symptoms Pt reports less pain at night and if she does wake up she is able to go back to sleep without problems.  At end of treatment pt states "Do you do the canilith procedure here becuase I need it?"  Pt reports what she calls vertigo with bed mobility.   Currently in Pain? No/denies                    [facility] Adult PT Treatment/Exercise - 09/20/14 0902    High Level Balance   High Level Balance Activities Side stepping;Other (comment)  with 2# weight on ankles;also in partial squat position   High Level Balance Comments Seated on blue therapy ball for bouncing, anterior/posterior, side/side, LAQ, hip flexion   Lumbar Exercises: Seated   Other Seated Lumbar Exercises seated anterior/posterior pelvic tilt x 20 with only verbal cues   Lumbar Exercises: Supine   Bridge 10 reps;3 seconds   Other Supine Lumbar Exercises Pelvic tilt x 10 anterior/posterior   Other Supine Lumbar Exercises LE's on red therapy ball performing hip  flexion/extension x 10   Knee/Hip Exercises: Aerobic   Elliptical 1 minute forward and 1 minute backward at level 1.5                  PT Short Term Goals - 09/04/14 1144    PT SHORT TERM GOAL #1   Title Pt will be independent in HEP to reduce pain, improve flexibility/strength, and to improve functional mobility. Target date: 09/28/14.   Status On-going   PT SHORT TERM GOAL #2   Title Pt will report decrease in B buttock/LE pain to </=5/10, while sitting for >60 minutes, in order to perform work duties. Target date: 09/28/14.   Status On-going   PT SHORT TERM GOAL #3   Title Perform balance and DGI assessment and write appropriate goal. Target date: 09/28/14.   Baseline Pt scored 20/24, DGI goal not appropriate as pt not at risk for falls.   Status Achieved   PT SHORT TERM GOAL #4   Title Pt will improve B hamstring flexibility to full knee extension with hip in 90 degree position while lying in supine, in order to decrease pain. Target date: 09/28/14.   Status On-going   PT SHORT TERM GOAL #5   Title Pt will report she is able to go dancing with friends without increase in B buttock/LE pain, in order to improve  quality of life. Target date: 09/28/14.   Status On-going           PT Long Term Goals - 09/04/14 1146    PT LONG TERM GOAL #1   Title Pt will verbalize plans to join a fitness center, or take dance lessons, to maintain gains made in PT and to improve quality of life. Target date: 10/26/14.   Status On-going   PT LONG TERM GOAL #2   Title Pt will improve ODI score by 12 points to improve quality of life. Target date: 10/26/14.   Status On-going   PT LONG TERM GOAL #3   Title Pt will report decrease in pain to </=3/10 when sitting >60 minutes to perform work tasks. Target date: 10/26/14.   Status On-going   PT LONG TERM GOAL #4   Title Pt will be able to ascend/descend 4 steps while carrying 20 pounds, independently, with LOB in order to walk dogs safely and without a  fear of falling. Target date: 10/26/14.   Status On-going               Plan - 09/20/14 1339    Clinical Impression Statement Pt reports decreased pain since last treatment.  Improved pelvic mobility today in supine and seated.   Pt will benefit from skilled therapeutic intervention in order to improve on the following deficits Abnormal gait;Impaired flexibility;Decreased endurance;Pain;Decreased balance;Decreased mobility;Decreased strength   Rehab Potential Good   PT Frequency 2x / week   PT Duration 8 weeks   PT Treatment/Interventions ADLs/Self Care Home Management;Gait training;Neuromuscular re-education;Ultrasound;Stair training;Biofeedback;Functional mobility training;Patient/family education;Therapeutic activities;Cryotherapy;Therapeutic exercise;Manual techniques;Balance training;DME Instruction   PT Next Visit Plan Continue pelvic/trunk activites.   Consulted and Agree with Plan of Care Patient        Problem List There are no active problems to display for this patient.   Leah Mathews, Leah Mathews 09/20/2014, 1:43 PM  [facility] [address] [place], [place], [address] Phone: [phone]   Fax:  [phone]     Leah CoralDenise Mathews Mathews, [place] [facility] 09/20/2014 1:43 PM Phone: [phone] Fax: [phone]

## 2014-09-22 ENCOUNTER — Ambulatory Visit: Payer: BC Managed Care – PPO

## 2014-09-22 DIAGNOSIS — M7918 Myalgia, other site: Secondary | ICD-10-CM

## 2014-09-22 DIAGNOSIS — M5430 Sciatica, unspecified side: Secondary | ICD-10-CM

## 2014-09-22 DIAGNOSIS — M791 Myalgia: Secondary | ICD-10-CM | POA: Diagnosis not present

## 2014-09-22 NOTE — Therapy (Signed)
[facility] [address] [place], [place], [address] Phone: [phone]   Fax:  [phone]  Physical Therapy Treatment  Patient Details  Name: Leah Mathews MRN: [id number] Date of Birth: 02/21/1951 Referring Provider:  Hollice EspyGates, Donna Ruth, MD  Encounter Date: 09/22/2014      PT End of Session - 09/22/14 1632    Visit Number 6   Number of Visits 17   Date for PT Re-Evaluation 10/30/14   Authorization Type BCBS   PT Start Time 0851   PT Stop Time 0929   PT Time Calculation (min) 38 min   Activity Tolerance Patient tolerated treatment well   Behavior During Therapy [facility] for tasks assessed/performed      Past Medical History  Diagnosis Date  . Epilepsy   . Depression     History reviewed. No pertinent past surgical history.  There were no vitals taken for this visit.  Visit Diagnosis:  Bilateral buttock pain  Sciatic leg pain      Subjective Assessment - 09/22/14 0853    Symptoms Pt denied falls or changes since last visit.  Pt took an Motrin yesterday for pain but states pain has been less overall. Pt reported she is sleeping much better and dreaming/not waking due to pain.   Pertinent History Self reported osteoporosis, chronic B knee pain, seizures   Limitations Sitting;Standing   How long can you sit comfortably? One hour   How long can you stand comfortably? 30 minutes   Patient Stated Goals Sleep through the night without pain/tossing and turning, reduce pain while sitting, dance without pain, more energy in order to clean house   Currently in Pain? No/denies     Neuro re-ed: -Sitting on blue exercise ball with min guard to supervision for safety: pelvic tilts (ant/post/lat) x10/direction, pelvic circles (clockwise/counter clockwise) x5/direction, B hip marches 3x10/LE with TrA activation, B shoulder flexion with feet together x5/LE. VC's and demonstration for technique. Pt required two standing rest  breaks due to R hip flexor cramp during B hip marches.  Therex: -Supine bridges with red theraband 3x10. VC's for technique. -Prone ER R LE only: AAROM 3x10 to improve hip ER strength. VC's for technique.                       PT Education - 09/22/14 1631    Education provided Yes   Education Details PT educated pt that we would need a new referral for vertigo, after this PT episode is complete since vertigo is not related to back/buttock pain. Provided pt with red theraband to use during clamshells and bridges. PT also discussed checking goals next week, and the potential to d/c early depending on progress.   Person(s) Educated Patient   Methods Explanation;Demonstration;Verbal cues   Comprehension Verbalized understanding;Returned demonstration;Need further instruction          PT Short Term Goals - 09/04/14 1144    PT SHORT TERM GOAL #1   Title Pt will be independent in HEP to reduce pain, improve flexibility/strength, and to improve functional mobility. Target date: 09/28/14.   Status On-going   PT SHORT TERM GOAL #2   Title Pt will report decrease in B buttock/LE pain to </=5/10, while sitting for >60 minutes, in order to perform work duties. Target date: 09/28/14.   Status On-going   PT SHORT TERM GOAL #3   Title Perform balance and DGI assessment and write appropriate goal. Target date: 09/28/14.   Baseline  Pt scored 20/24, DGI goal not appropriate as pt not at risk for falls.   Status Achieved   PT SHORT TERM GOAL #4   Title Pt will improve B hamstring flexibility to full knee extension with hip in 90 degree position while lying in supine, in order to decrease pain. Target date: 09/28/14.   Status On-going   PT SHORT TERM GOAL #5   Title Pt will report she is able to go dancing with friends without increase in B buttock/LE pain, in order to improve quality of life. Target date: 09/28/14.   Status On-going           PT Long Term Goals - 09/04/14 1146    PT  LONG TERM GOAL #1   Title Pt will verbalize plans to join a fitness center, or take dance lessons, to maintain gains made in PT and to improve quality of life. Target date: 10/26/14.   Status On-going   PT LONG TERM GOAL #2   Title Pt will improve ODI score by 12 points to improve quality of life. Target date: 10/26/14.   Status On-going   PT LONG TERM GOAL #3   Title Pt will report decrease in pain to </=3/10 when sitting >60 minutes to perform work tasks. Target date: 10/26/14.   Status On-going   PT LONG TERM GOAL #4   Title Pt will be able to ascend/descend 4 steps while carrying 20 pounds, independently, with LOB in order to walk dogs safely and without a fear of falling. Target date: 10/26/14.   Status On-going               Plan - 09/22/14 1632    Clinical Impression Statement Pt demonstrated progress, as she reported 0/10 pain today and that pain has been improving since she began PT. Pt also able to progress from yellow theraband to red band, indicating improved strength. Continue with POC.   Pt will benefit from skilled therapeutic intervention in order to improve on the following deficits Abnormal gait;Impaired flexibility;Decreased endurance;Pain;Decreased balance;Decreased mobility;Decreased strength   Rehab Potential Good   PT Frequency 2x / week   PT Duration 8 weeks   PT Treatment/Interventions ADLs/Self Care Home Management;Gait training;Neuromuscular re-education;Ultrasound;Stair training;Biofeedback;Functional mobility training;Patient/family education;Therapeutic activities;Cryotherapy;Therapeutic exercise;Manual techniques;Balance training;DME Instruction   PT Next Visit Plan Assess STGs and add additional visits prn.   Consulted and Agree with Plan of Care Patient        Problem List There are no active problems to display for this patient.   Riven Beebe L 09/22/2014, 4:37 PM  [facility] [address] [place], [place], [address] Phone: [phone]   Fax:  [phone]    Zerita Boers, PT,DPT 09/22/2014 4:37 PM Phone: [phone] Fax: [phone]

## 2014-09-25 ENCOUNTER — Ambulatory Visit: Payer: BC Managed Care – PPO | Admitting: Physical Therapy

## 2014-09-25 ENCOUNTER — Encounter: Payer: Self-pay | Admitting: Physical Therapy

## 2014-09-25 DIAGNOSIS — M5430 Sciatica, unspecified side: Secondary | ICD-10-CM

## 2014-09-25 DIAGNOSIS — M7918 Myalgia, other site: Secondary | ICD-10-CM

## 2014-09-25 DIAGNOSIS — M791 Myalgia: Secondary | ICD-10-CM | POA: Diagnosis not present

## 2014-09-25 NOTE — Therapy (Signed)
[facility] [address] [place], [place], [address] Phone: [phone]   Fax:  [phone]  Physical Therapy Treatment  Patient Details  Name: Leah Mathews MRN: [id number] Date of Birth: 07/28/1951 Referring Provider:  Hollice Espy, MD  Encounter Date: 09/25/2014      PT End of Session - 09/25/14 1310    Visit Number 7   Number of Visits 17   Date for PT Re-Evaluation 10/30/14   Authorization Type BCBS   PT Start Time [phone]   PT Stop Time 0930   PT Time Calculation (min) 41 min   Activity Tolerance Patient tolerated treatment well   Behavior During Therapy [facility] for tasks assessed/performed      Past Medical History  Diagnosis Date  . Epilepsy   . Depression     History reviewed. No pertinent past surgical history.  There were no vitals taken for this visit.  Visit Diagnosis:  Bilateral buttock pain  Sciatic leg pain      Subjective Assessment - 09/25/14 0854    Symptoms States she had pain after therapy Friday and Saturday after sitting in metal chair outside for baby shower.   Currently in Pain? No/denies                    [facility] Adult PT Treatment/Exercise - 09/25/14 0919    High Level Balance   High Level Balance Comments Seated on blue therapy ball for bouncing, anterior/posterior, side/side, pelvic circles bil directions,LAQ, hip flexion   Lumbar Exercises: Stretches   Active Hamstring Stretch 30 seconds;1 rep   Active Hamstring Stretch Limitations seated edge of mat-needed cues on degree of stretch   Lumbar Exercises: Aerobic   Stationary Bike Scifit level 1.5 all 4 extremities x 8 minutes.  Pt very cautious.   Elliptical 1 minute forward and 1 minute backward level 1.4   Lumbar Exercises: Seated   Other Seated Lumbar Exercises seated anterior/posterior pelvic tilt x 20 with only verbal cues   Lumbar Exercises: Supine   Clam 10 reps;3 seconds;Other (comment)  bil sides with  yellow theraband   Bridge 10 reps;3 seconds   Other Supine Lumbar Exercises Pelvic tilt x 10 anterior/posterior   Other Supine Lumbar Exercises LE's on red therapy ball performing hip flexion/extension x 10   Knee/Hip Exercises: Stretches   Piriformis Stretch 1 rep;20 seconds   Piriformis Stretch Limitations bil le in supine   Knee/Hip Exercises: Aerobic   Stationary Bike Scifit lev el 1.5 all 4 extremities x 8 minutes                PT Education - 09/25/14 1309    Education provided Yes   Education Details Benefits of water therapy and continued mobility, difference between stretch and pain.   Person(s) Educated Patient   Methods Explanation;Verbal cues   Comprehension Verbalized understanding          PT Short Term Goals - 09/04/14 1144    PT SHORT TERM GOAL #1   Title Pt will be independent in HEP to reduce pain, improve flexibility/strength, and to improve functional mobility. Target date: 09/28/14.   Status On-going   PT SHORT TERM GOAL #2   Title Pt will report decrease in B buttock/LE pain to </=5/10, while sitting for >60 minutes, in order to perform work duties. Target date: 09/28/14.   Status On-going   PT SHORT TERM GOAL #3   Title Perform balance and DGI assessment and write appropriate  goal. Target date: 09/28/14.   Baseline Pt scored 20/24, DGI goal not appropriate as pt not at risk for falls.   Status Achieved   PT SHORT TERM GOAL #4   Title Pt will improve B hamstring flexibility to full knee extension with hip in 90 degree position while lying in supine, in order to decrease pain. Target date: 09/28/14.   Status On-going   PT SHORT TERM GOAL #5   Title Pt will report she is able to go dancing with friends without increase in B buttock/LE pain, in order to improve quality of life. Target date: 09/28/14.   Status On-going           PT Long Term Goals - 09/04/14 1146    PT LONG TERM GOAL #1   Title Pt will verbalize plans to join a fitness center, or take  dance lessons, to maintain gains made in PT and to improve quality of life. Target date: 10/26/14.   Status On-going   PT LONG TERM GOAL #2   Title Pt will improve ODI score by 12 points to improve quality of life. Target date: 10/26/14.   Status On-going   PT LONG TERM GOAL #3   Title Pt will report decrease in pain to </=3/10 when sitting >60 minutes to perform work tasks. Target date: 10/26/14.   Status On-going   PT LONG TERM GOAL #4   Title Pt will be able to ascend/descend 4 steps while carrying 20 pounds, independently, with LOB in order to walk dogs safely and without a fear of falling. Target date: 10/26/14.   Status On-going               Plan - 09/25/14 1310    Clinical Impression Statement Pt reports pain over weekend and after therapy session Friday in back at 7/10 level.  Continues to be cautious to move or perform any movement that she fears may cause pain.  continue to educate on difference between soreness/stretching and true pain.   Pt will benefit from skilled therapeutic intervention in order to improve on the following deficits Abnormal gait;Impaired flexibility;Decreased endurance;Pain;Decreased balance;Decreased mobility;Decreased strength   Rehab Potential Good   PT Frequency 2x / week   PT Duration 8 weeks   PT Treatment/Interventions ADLs/Self Care Home Management;Gait training;Neuromuscular re-education;Ultrasound;Stair training;Biofeedback;Functional mobility training;Patient/family education;Therapeutic activities;Cryotherapy;Therapeutic exercise;Manual techniques;Balance training;DME Instruction   PT Next Visit Plan Finish checking goals and discuss continuing PT vs d/c.  Needs to schedule more visits if continuing.   Consulted and Agree with Plan of Care Patient        Problem List There are no active problems to display for this patient.   Newell CoralRobertson, Almer Bushey Terry 09/25/2014, 1:14 PM  [facility] [address] [place], [place], [address] Phone: [phone]   Fax:  [phone]     Newell CoralDenise Terry Osiah Haring, VirginiaPTA [facility] 09/25/2014 1:14 PM Phone: [phone] Fax: [phone]

## 2014-09-27 ENCOUNTER — Ambulatory Visit: Payer: BC Managed Care – PPO | Attending: Diagnostic Neuroimaging

## 2014-09-27 DIAGNOSIS — M5441 Lumbago with sciatica, right side: Secondary | ICD-10-CM | POA: Diagnosis not present

## 2014-09-27 DIAGNOSIS — G40909 Epilepsy, unspecified, not intractable, without status epilepticus: Secondary | ICD-10-CM | POA: Insufficient documentation

## 2014-09-27 DIAGNOSIS — M791 Myalgia: Secondary | ICD-10-CM | POA: Insufficient documentation

## 2014-09-27 DIAGNOSIS — M7918 Myalgia, other site: Secondary | ICD-10-CM

## 2014-09-27 DIAGNOSIS — M5430 Sciatica, unspecified side: Secondary | ICD-10-CM

## 2014-09-27 NOTE — Patient Instructions (Signed)
Abduction: Clam (Eccentric) - Side-Lying   Lie on side with knees bent. Lift top knee, keeping feet together. Keep trunk steady, keep hips forward. Place pillow behind your back, or perform on sofa with your back against the sofa back to make sure you stay forward. __10_ reps per set, _3__ sets per day, _3-4__ days per week. Use yellow theraband, and then progress to using red theraband once 3 sets of 10 becomes easy.  Copyright  VHI. All rights reserved.   1) Progress piriformis stretch (legs crossed) by performing in position listed below: Piriformis: Swan   With hands on floor, shoulder-width apart, bring right knee forward so that knee is facing to the right and sole of right foot is facing left. Extend left leg back. Tighten abdominal muscles to stabilize the spine.  Hold for 30 seconds and repeat with other leg. Repeat _3__ times, each side. Do _2-3__ times per day. **You can progress this by performing this in standing with leg on chair or desk.  Copyright  VHI. All rights reserved.   **Progress seated hamstring stretch by performing with foot on stool or chair.  **Use heating pad prior to an activity for 15 minutes, use ice pack for 10-15 minutes after an activity to reduce pain. Make sure to place 3-5 layers of cloth between heat/ice and your side to protect your skin.**

## 2014-09-27 NOTE — Therapy (Signed)
[facility] [address] [place], [place], [address] Phone: [phone]   Fax:  [phone]  Physical Therapy Treatment  Patient Details  Name: Leah Mathews MRN: [id number] Date of Birth: 1951/01/10 Referring Provider:  Marjorie Smolder, MD  Encounter Date: 09/27/2014      PT End of Session - 09/27/14 1643    Visit Number 8   Number of Visits 17   Date for PT Re-Evaluation 10/30/14   Authorization Type BCBS   PT Start Time 0848   PT Stop Time 0927   PT Time Calculation (min) 39 min   Activity Tolerance Patient tolerated treatment well   Behavior During Therapy [facility] for tasks assessed/performed      Past Medical History  Diagnosis Date  . Epilepsy   . Depression     History reviewed. No pertinent past surgical history.  There were no vitals taken for this visit.  Visit Diagnosis:  Bilateral buttock pain  Sciatic leg pain      Subjective Assessment - 09/27/14 0849    Symptoms Pt reported she woke up for about 15 minutes last night due to R LE pain (6/10), and placed heating pad on R hip and was able to fall back asleep. She reported she feels better now than she did this weekend.  Pt reported she would like to hold PT for 2 weeks to see how she feels, as she is worried about a high co-pay and she feels better overall.   Pertinent History Self reported osteoporosis, chronic B knee pain, seizures   Limitations Sitting;Standing   How long can you sit comfortably? One hour   How long can you stand comfortably? 30 minutes   Patient Stated Goals Sleep through the night without pain/tossing and turning, reduce pain while sitting, dance without pain, more energy in order to clean house   Currently in Pain? No/denies                    [facility] Adult PT Treatment/Exercise - 09/27/14 0908    Exercises   Exercises Knee/Hip   Lumbar Exercises: Supine   Bridge 10 reps   Bridge Limitations with TrA  activitation.   Knee/Hip Exercises: Stretches   Active Hamstring Stretch 3 reps;30 seconds   Active Hamstring Stretch Limitations B LE. Pt performed with heel on floor, stool, and chair, she reported heel on floor/stool being more comfortable.   Passive Hamstring Stretch 1 rep;20 seconds   Passive Hamstring Stretch Limitations Pt able to obtain knee ext. in supine with hip flexed to 90 degrees.   Piriformis Stretch 3 reps;30 seconds   Piriformis Stretch Limitations B LE in supine; PT demonstrated piriformis stretch on mat and in standing for pt to progress as tolerated.   Gastroc Stretch 3 reps;30 seconds   Gastroc Stretch Limitations B LE in standing. PT educated pt to perform one LE at a time vs. B LE to support low back and decrease pain.   Knee/Hip Exercises: Supine   Bridges Strengthening;2 sets;10 reps   Bridges Limitations Cues to activate TrA to improve core strength.   Knee/Hip Exercises: Sidelying   Clams B LE; 3x10 with yellow theraband. VC's to keep forwards.                PT Education - 09/27/14 1641    Education provided Yes   Education Details Reiterated the importance of continuing HEP and progressing as tolerated. PT also discussed the difference between muscle soreness  from HEP vs. reproduction of low back and leg pain. PT educated pt on using heat prior to activity and ice after activity to decr. pain.   Person(s) Educated Patient   Methods Explanation;Demonstration;Verbal cues;Handout   Comprehension Verbalized understanding;Returned demonstration          PT Short Term Goals - 09/27/14 1643    PT SHORT TERM GOAL #1   Title Pt will be independent in HEP to reduce pain, improve flexibility/strength, and to improve functional mobility. Target date: 09/28/14.   Status Achieved   PT SHORT TERM GOAL #2   Title Pt will report decrease in B buttock/LE pain to </=5/10, while sitting for >60 minutes, in order to perform work duties. Target date: 09/28/14.   Baseline  6/10 on average   Status Partially Met   PT SHORT TERM GOAL #3   Title Perform balance and DGI assessment and write appropriate goal. Target date: 09/28/14.   Baseline Pt scored 20/24, DGI goal not appropriate as pt not at risk for falls.   Status Achieved   PT SHORT TERM GOAL #4   Title Pt will improve B hamstring flexibility to full knee extension with hip in 90 degree position while lying in supine, in order to decrease pain. Target date: 09/28/14.   Status Achieved   PT SHORT TERM GOAL #5   Title Pt will report she is able to go dancing with friends without increase in B buttock/LE pain, in order to improve quality of life. Target date: 09/28/14.   Baseline pt has not attempted.   Status Not Met           PT Long Term Goals - 09/04/14 1146    PT LONG TERM GOAL #1   Title Pt will verbalize plans to join a fitness center, or take dance lessons, to maintain gains made in PT and to improve quality of life. Target date: 10/26/14.   Status On-going   PT LONG TERM GOAL #2   Title Pt will improve ODI score by 12 points to improve quality of life. Target date: 10/26/14.   Status On-going   PT LONG TERM GOAL #3   Title Pt will report decrease in pain to </=3/10 when sitting >60 minutes to perform work tasks. Target date: 10/26/14.   Status On-going   PT LONG TERM GOAL #4   Title Pt will be able to ascend/descend 4 steps while carrying 20 pounds, independently, with LOB in order to walk dogs safely and without a fear of falling. Target date: 10/26/14.   Status On-going               Plan - 09/27/14 1651    Clinical Impression Statement Pt demonstrated progress, as she met STGs 1 and 4. She has not met her pain goal or the goal to dance with friends, as she has not attempted to dance and pain was decreased last week but it increased over the weekend. PT will place pt on hold, per pt request and will d/c pt if she has not reschedule appt. in 2 weeks. Continue with POC.   Pt will benefit from  skilled therapeutic intervention in order to improve on the following deficits Abnormal gait;Impaired flexibility;Decreased endurance;Pain;Decreased balance;Decreased mobility;Decreased strength   Rehab Potential Good   PT Frequency 2x / week   PT Duration 8 weeks   PT Treatment/Interventions ADLs/Self Care Home Management;Gait training;Neuromuscular re-education;Ultrasound;Stair training;Biofeedback;Functional mobility training;Patient/family education;Therapeutic activities;Cryotherapy;Therapeutic exercise;Manual techniques;Balance training;DME Instruction   PT Next Visit Plan Strength  training.   Consulted and Agree with Plan of Care Patient        Problem List There are no active problems to display for this patient.   Damen Windsor L 09/27/2014, 4:53 PM  [facility] [address] [place], [place], [address] Phone: [phone]   Fax:  [phone]     Geoffry Paradise, PT,DPT 09/27/2014 4:53 PM Phone: [phone] Fax: [phone]

## 2014-10-11 NOTE — Therapy (Signed)
[facility] [address] [place], [place], [address] Phone: [phone]   Fax:  [phone]  Patient Details  Name: Leah Mathews MRN: [id number] Date of Birth: [birth year] Referring Provider:  No ref. provider found  Encounter Date: 10/11/2014  PHYSICAL THERAPY DISCHARGE SUMMARY  Visits from Start of Care: 8  Current functional level related to goals / functional outcomes:     PT Short Term Goals - 09/27/14 1643    PT SHORT TERM GOAL #1   Title Pt will be independent in HEP to reduce pain, improve flexibility/strength, and to improve functional mobility. Target date: 09/28/14.   Status Achieved   PT SHORT TERM GOAL #2   Title Pt will report decrease in B buttock/LE pain to </=5/10, while sitting for >60 minutes, in order to perform work duties. Target date: 09/28/14.   Baseline 6/10 on average   Status Partially Met   PT SHORT TERM GOAL #3   Title Perform balance and DGI assessment and write appropriate goal. Target date: 09/28/14.   Baseline Pt scored 20/24, DGI goal not appropriate as pt not at risk for falls.   Status Achieved   PT SHORT TERM GOAL #4   Title Pt will improve B hamstring flexibility to full knee extension with hip in 90 degree position while lying in supine, in order to decrease pain. Target date: 09/28/14.   Status Achieved   PT SHORT TERM GOAL #5   Title Pt will report she is able to go dancing with friends without increase in B buttock/LE pain, in order to improve quality of life. Target date: 09/28/14.   Baseline pt has not attempted.   Status Not Met           PT Long Term Goals - 09/04/14 1146    PT LONG TERM GOAL #1   Title Pt will verbalize plans to join a fitness center, or take dance lessons, to maintain gains made in PT and to improve quality of life. Target date: 10/26/14.   Status On-going   PT LONG TERM GOAL #2   Title Pt will improve ODI score by 12 points to improve quality of life.  Target date: 10/26/14.   Status On-going   PT LONG TERM GOAL #3   Title Pt will report decrease in pain to </=3/10 when sitting >60 minutes to perform work tasks. Target date: 10/26/14.   Status On-going   PT LONG TERM GOAL #4   Title Pt will be able to ascend/descend 4 steps while carrying 20 pounds, independently, with LOB in order to walk dogs safely and without a fear of falling. Target date: 10/26/14.   Status On-going        Remaining deficits: Unknown, as pt did not return. During last PT appointment, pt reported she was feeling better and would like to cease PT for 2 weeks to see how she felt. Pt said that she would call back to schedule additional PT visits prn, and if she did not call back it was understood that PT would d/c pt. It has been 2 weeks, therefore, PT is discharging pt.   Education / Equipment: HEP  Plan: Patient agrees to discharge.  Patient goals were partially met. Patient is being discharged due to being pleased with the current functional level.  ?????       Louann Hopson L 10/11/2014, 1:10 PM  [facility] [address] [place], [place], [address] Phone: [phone]   Fax:  [phone]   Geoffry Paradise, PT,DPT 10/11/2014 1:10 PM Phone: [phone] Fax: [phone]

## 2014-11-28 ENCOUNTER — Encounter: Payer: Self-pay | Admitting: Diagnostic Neuroimaging

## 2014-11-28 ENCOUNTER — Ambulatory Visit (INDEPENDENT_AMBULATORY_CARE_PROVIDER_SITE_OTHER): Payer: BC Managed Care – PPO | Admitting: Diagnostic Neuroimaging

## 2014-11-28 VITALS — BP 119/77 | HR 66 | Ht 64.5 in | Wt 156.6 lb

## 2014-11-28 DIAGNOSIS — M79604 Pain in right leg: Secondary | ICD-10-CM | POA: Diagnosis not present

## 2014-11-28 DIAGNOSIS — M79605 Pain in left leg: Secondary | ICD-10-CM
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
## 2014-11-28 NOTE — Patient Instructions (Signed)
Monitor symptoms. 

## 2015-07-29 HISTORY — PX: OTHER SURGICAL HISTORY: SHX169

## 2019-06-09 ENCOUNTER — Emergency Department (HOSPITAL_COMMUNITY)
Admission: EM | Admit: 2019-06-09 | Discharge: 2019-06-10 | Disposition: A | Discharge status: Home / Self Care | Payer: Medicare Other | Attending: Emergency Medicine | Admitting: Emergency Medicine

## 2019-06-09 ENCOUNTER — Emergency Department (HOSPITAL_COMMUNITY): Payer: Medicare Other

## 2019-06-09 ENCOUNTER — Other Ambulatory Visit: Payer: Self-pay

## 2019-06-09 ENCOUNTER — Encounter (HOSPITAL_COMMUNITY): Payer: Self-pay

## 2019-06-09 DIAGNOSIS — Z79899 Other long term (current) drug therapy: Secondary | ICD-10-CM | POA: Diagnosis not present

## 2019-06-09 DIAGNOSIS — Y999 Unspecified external cause status: Secondary | ICD-10-CM | POA: Diagnosis not present

## 2019-06-09 DIAGNOSIS — M25551 Pain in right hip: Secondary | ICD-10-CM | POA: Diagnosis not present

## 2019-06-09 DIAGNOSIS — S0003XA Contusion of scalp, initial encounter: Secondary | ICD-10-CM | POA: Diagnosis not present

## 2019-06-09 DIAGNOSIS — N289 Disorder of kidney and ureter, unspecified: Secondary | ICD-10-CM | POA: Diagnosis not present

## 2019-06-09 DIAGNOSIS — Y929 Unspecified place or not applicable: Secondary | ICD-10-CM | POA: Insufficient documentation

## 2019-06-09 DIAGNOSIS — Y93K1 Activity, walking an animal: Secondary | ICD-10-CM | POA: Insufficient documentation

## 2019-06-09 DIAGNOSIS — R519 Headache, unspecified: Secondary | ICD-10-CM | POA: Diagnosis present

## 2019-06-09 LAB — I-STAT CHEM 8, ED
BUN: 23 mg/dL (ref 8–23)
Calcium, Ion: 1.11 mmol/L — ABNORMAL LOW (ref 1.15–1.40)
Chloride: 103 mmol/L (ref 98–111)
Creatinine, Ser: 1.2 mg/dL — ABNORMAL HIGH (ref 0.44–1.00)
Glucose, Bld: 108 mg/dL — ABNORMAL HIGH (ref 70–99)
HCT: 45 % (ref 36.0–46.0)
Hemoglobin: 15.3 g/dL — ABNORMAL HIGH (ref 12.0–15.0)
Potassium: 3.8 mmol/L (ref 3.5–5.1)
Sodium: 139 mmol/L (ref 135–145)
TCO2: 24 mmol/L (ref 22–32)

## 2019-06-09 LAB — COMPREHENSIVE METABOLIC PANEL
ALT: 26 U/L (ref 0–44)
AST: 32 U/L (ref 15–41)
Albumin: 3.9 g/dL (ref 3.5–5.0)
Alkaline Phosphatase: 69 U/L (ref 38–126)
Anion gap: 12 (ref 5–15)
BUN: 19 mg/dL (ref 8–23)
CO2: 23 mmol/L (ref 22–32)
Calcium: 9.4 mg/dL (ref 8.9–10.3)
Chloride: 103 mmol/L (ref 98–111)
Creatinine, Ser: 1.19 mg/dL — ABNORMAL HIGH (ref 0.44–1.00)
GFR calc Af Amer: 55 mL/min — ABNORMAL LOW (ref >60)
GFR calc non Af Amer: 48 mL/min — ABNORMAL LOW (ref >60)
Glucose, Bld: 113 mg/dL — ABNORMAL HIGH (ref 70–99)
Potassium: 3.8 mmol/L (ref 3.5–5.1)
Sodium: 138 mmol/L (ref 135–145)
Total Bilirubin: 0.7 mg/dL (ref 0.3–1.2)
Total Protein: 7.2 g/dL (ref 6.5–8.1)

## 2019-06-09 LAB — SAMPLE TO BLOOD BANK

## 2019-06-09 LAB — CDS SEROLOGY

## 2019-06-09 LAB — CBC
HCT: 44 % (ref 36.0–46.0)
Hemoglobin: 15.2 g/dL — ABNORMAL HIGH (ref 12.0–15.0)
MCH: 32.4 pg (ref 26.0–34.0)
MCHC: 34.5 g/dL (ref 30.0–36.0)
MCV: 93.8 fL (ref 80.0–100.0)
Platelets: 177 10*3/uL (ref 150–400)
RBC: 4.69 MIL/uL (ref 3.87–5.11)
RDW: 11.8 % (ref 11.5–15.5)
WBC: 7.9 10*3/uL (ref 4.0–10.5)
nRBC: 0 % (ref 0.0–0.2)

## 2019-06-09 LAB — PROTIME-INR
INR: 1 (ref 0.8–1.2)
Prothrombin Time: 13.4 seconds (ref 11.4–15.2)

## 2019-06-09 LAB — LACTIC ACID, PLASMA: Lactic Acid, Venous: 1.4 mmol/L (ref 0.5–1.9)

## 2019-06-09 LAB — ETHANOL: Alcohol, Ethyl (B): <10 mg/dL (ref <10)

## 2019-06-09 MED ORDER — LACTATED RINGERS IV BOLUS
1000.0000 mL | Freq: Once | INTRAVENOUS | Status: AC
  Administered 2019-06-09: 17:00:00 1000 mL via INTRAVENOUS

## 2019-06-09 MED ORDER — LACTATED RINGERS IV BOLUS: 1000.0000 mL | Freq: Once | INTRAVENOUS | Status: DC

## 2019-06-09 MED ORDER — MECLIZINE HCL 25 MG PO TABS
25.0000 mg | ORAL_TABLET | Freq: Once | ORAL | Status: AC
  Administered 2019-06-09: 25 mg via ORAL
  Filled 2019-06-09: qty 1
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
## 2019-06-09 NOTE — ED Notes (Signed)
Walked pt and gave pt a sandwich per MD Vaithi. Pt is eating and tolerating food fine. Pt also tolerated walking but pt's HR went into the 130s. Pt is currently back in bed resting with HR still in the 120s.
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
## 2019-06-09 NOTE — ED Notes (Signed)
Patient transported to CT 

## 2019-06-10 ENCOUNTER — Encounter: Payer: Self-pay | Admitting: Diagnostic Neuroimaging

## 2019-07-07 ENCOUNTER — Other Ambulatory Visit: Payer: Self-pay | Admitting: Family Medicine

## 2019-07-07 DIAGNOSIS — M8588 Other specified disorders of bone density and structure, other site: Secondary | ICD-10-CM

## 2019-07-28 ENCOUNTER — Ambulatory Visit: Payer: BC Managed Care – PPO | Admitting: Allergy & Immunology

## 2019-08-02 ENCOUNTER — Ambulatory Visit: Payer: Medicare PPO | Admitting: Physical Therapy

## 2019-08-10 ENCOUNTER — Other Ambulatory Visit: Payer: Self-pay

## 2019-08-10 ENCOUNTER — Ambulatory Visit: Payer: Medicare PPO | Attending: Family Medicine | Admitting: Physical Therapy

## 2019-08-10 ENCOUNTER — Encounter: Payer: Self-pay | Admitting: Physical Therapy

## 2019-08-10 DIAGNOSIS — M6281 Muscle weakness (generalized): Secondary | ICD-10-CM | POA: Diagnosis present

## 2019-08-10 DIAGNOSIS — M25511 Pain in right shoulder: Secondary | ICD-10-CM

## 2019-08-10 DIAGNOSIS — M62838 Other muscle spasm: Secondary | ICD-10-CM | POA: Diagnosis present

## 2019-08-10 NOTE — Patient Instructions (Signed)
Access Code: [email or web address]  URL: [email or web address]  Date: 08/10/2019  Prepared by: Donita Brooks    Exercises Sidelying Shoulder Abduction Palm Forward- 15-20 reps- 1x daily- 7x weekly    [facility] [address] [place], [place] [address] Phone # [phone] Fax [phone]
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
## 2019-08-11 ENCOUNTER — Other Ambulatory Visit: Payer: Self-pay

## 2019-08-11 ENCOUNTER — Ambulatory Visit: Payer: Medicare PPO | Admitting: Allergy & Immunology

## 2019-08-11 ENCOUNTER — Encounter: Payer: Self-pay | Admitting: Allergy & Immunology

## 2019-08-11 VITALS — BP 134/78 | HR 66 | Temp 96.9°F | Resp 18 | Ht 66.0 in | Wt 146.8 lb

## 2019-08-11 DIAGNOSIS — R21 Rash and other nonspecific skin eruption: Secondary | ICD-10-CM | POA: Diagnosis not present

## 2019-08-11 DIAGNOSIS — T50Z95D Adverse effect of other vaccines and biological substances, subsequent encounter: Secondary | ICD-10-CM

## 2019-08-11 NOTE — Progress Notes (Signed)
NEW PATIENT  Date of Service/Encounter:  08/11/19  Referring provider: Shon Hale, MD   Assessment:   Vaccine reaction - unclear on the details   Ms. Talwar presents with a concern of an adverse reaction to tetanus vaccine.  This occurred over 30 years ago and consisted of a rash.  She had no other systemic symptoms including shortness of breath, wheezing, stomach pain, or passing out.  She did not require any kind of emergency intervention for this.  She has absolutely no pictures to show me and she did not require an extra clinic visit.  She thinks she might of gotten a dose of prednisone to treat it, but the history is very vague.  I not think there is any testing that we need to do given the low risk history.  Even if we did test with the tetanus vaccine today, I can guarantee that it has different ingredients compared to the tetanus vaccine from 30 years ago.  I think it would be more prudent and useful for her to get the tetanus vaccine in our clinic in a graded dose (10% followed by a 90%) to see how she does clinically.  We can also do the same thing with the Pneumovax vaccination as well.  She is in agreement with this plan and we will schedule it as soon as we can.   Plan/Recommendations:   1. Vaccine reaction - I think we can schedule you for a Tetanus and a Pneumovax challenge. - Make these appointments on your way out. - These will be two hours long. - I do not think that testing is needed since there is no history of anaphylaxis.   2. Return for VACCINE CHALLENGES.   Subjective:   Taylore Hinde is a 69 y.o. female presenting today for evaluation of  Chief Complaint  Patient presents with  . Allergy Testing    Tetanus Vaccine    Aarohi Redditt has a history of the following: There are no problems to display for this patient.   History obtained from: chart review and patient.  Tajanae Guilbault was referred by Shon Hale, MD.     Josephyne is a  69 y.o. female presenting for an evaluation of vaccine reactions.  The history is rather vague, as it occurred several decades ago. She received a Tetanus around 30 years ago. This was a regular booster rather than the DTaP. She developed a rash on her legs and maybe another part of her body.  However, the history is very vague.  She did not require any emergency intervention.  She thinks she might have either gotten a steroid ointment or a systemic steroid to treat this.  She denies any other symptoms including shortness of breath, wheezing, throat swelling, stomach pain, swelling, or any other concerns.  However, she was told by her primary care provider at the time that she was likely allergic to the tetanus vaccine and needed to avoid getting it again.  In the interim, she has had other vaccinations without a problem including flu shots.  She has also traveled outside of the country, including to [place] on a couple of occasions, and received what ever vaccinations were recommended but those trips as well.  Since she turned 5, it was recommended that she get a Pneumovax.  However, with a history of a tetanus reaction, she wanted to talk to [place] before proceeding.  She otherwise has no atopic history.  She does have a history of seasonal allergic  rhinitis, which she treats with an over-the-counter antihistamine.  She has never undergone allergy testing for environmental allergies.  She has no food allergies.  Otherwise, there is no history of other atopic diseases, including asthma, stinging insect allergies, eczema, urticaria or contact dermatitis. There is no significant infectious history. Vaccinations are up to date.    Past Medical History: There are no problems to display for this patient.   Medication List:  Allergies as of 08/11/2019      Reactions   Ciprofloxacin Rash   Sulfa Antibiotics Rash   Tetanus Toxoid Rash   Amitriptyline Other (See Comments)   Depression   Ciprofloxacin Rash    Sulfur Rash   Tetanus Toxoids Rash      Medication List       Accurate as of August 11, 2019 11:59 PM. If you have any questions, ask your nurse or doctor.        albuterol 108 (90 Base) MCG/ACT inhaler Commonly known as: VENTOLIN HFA Inhale 2 puffs into the lungs every 6 (six) hours as needed for wheezing or shortness of breath.   ALPRAZolam 0.25 MG tablet Commonly known as: XANAX Take 1 tablet by mouth as needed.   CALCIUM 1200 PO Take 2 tablets by mouth daily.   calcium carbonate 1250 (500 Ca) MG tablet Commonly known as: OS-CAL - dosed in mg of elemental calcium Take 1 tablet by mouth daily with breakfast.   cholecalciferol 1000 units tablet Commonly known as: VITAMIN D Take 1,000 Units by mouth daily.   ibuprofen 200 MG tablet Commonly known as: ADVIL Take 200-400 mg by mouth every 6 (six) hours as needed for headache or mild pain.   Magnesium 200 MG Tabs Take 1 tablet by mouth daily.   SYSTANE FREE OP Apply 2 drops to eye as needed (dry eyes).   VITAMIN D PO Take 1 capsule by mouth daily.       Birth History: non-contributory  Developmental History: non-contributory  Past Surgical History: History reviewed. No pertinent surgical history.   Family History: Family History  Problem Relation Age of Onset  . Congestive Heart Failure Mother   . Heart disease Father      Social History: Marilu lives at home with her family.  They live in a house that is 67 years old.  There is wood throughout the home.  Have gas heating and central cooling.  There is a dog inside of the home.  There are no dust mite covers on the bedding.  There is no tobacco exposure.  She is currently retired.   Review of Systems  Constitutional: Negative.  Negative for chills, fever, malaise/fatigue and weight loss.  HENT: Negative.  Negative for congestion, ear discharge, ear pain, sinus pain and sore throat.   Eyes: Negative for pain, discharge and redness.  Respiratory:  Negative for cough, sputum production, shortness of breath and wheezing.   Cardiovascular: Negative.  Negative for chest pain and palpitations.  Gastrointestinal: Negative for abdominal pain, constipation, diarrhea, heartburn, nausea and vomiting.  Skin: Negative.  Negative for itching and rash.  Neurological: Negative for dizziness and headaches.  Endo/Heme/Allergies: Negative for environmental allergies. Does not bruise/bleed easily.       Objective:   Blood pressure 134/78, pulse 66, temperature (!) 96.9 F (36.1 C), temperature source Temporal, resp. rate 18, height 5\' 6"  (1.676 m), weight 146 lb 12.8 oz (66.6 kg), SpO2 97 %. Body mass index is 23.69 kg/m.   Physical Exam:   Physical Exam  Constitutional: She appears well-developed.  Pleasant female. Cooperative with the exam.   HENT:  Head: Normocephalic and atraumatic.  Right Ear: Tympanic membrane, external ear and ear canal normal. No drainage, swelling or tenderness. Tympanic membrane is not injected, not scarred, not erythematous, not retracted and not bulging.  Left Ear: Tympanic membrane, external ear and ear canal normal. No drainage, swelling or tenderness. Tympanic membrane is not injected, not scarred, not erythematous, not retracted and not bulging.  Nose: Mucosal edema present. No rhinorrhea, nasal deformity or septal deviation. No epistaxis. Right sinus exhibits no maxillary sinus tenderness and no frontal sinus tenderness. Left sinus exhibits no maxillary sinus tenderness and no frontal sinus tenderness.  Mouth/Throat: Uvula is midline and oropharynx is clear and moist. Mucous membranes are not pale and not dry.  Eyes: Pupils are equal, round, and reactive to light. Conjunctivae and EOM are normal. Right eye exhibits no chemosis and no discharge. Left eye exhibits no chemosis and no discharge. Right conjunctiva is not injected. Left conjunctiva is not injected.  Cardiovascular: Normal rate, regular rhythm and normal  heart sounds.  Respiratory: Effort normal and breath sounds normal. No accessory muscle usage. No tachypnea. No respiratory distress. She has no wheezes. She has no rhonchi. She has no rales. She exhibits no tenderness.  Moving air well in all lung fields.  GI: There is no abdominal tenderness. There is no rebound and no guarding.  Lymphadenopathy:       Head (right side): No submandibular, no tonsillar and no occipital adenopathy present.       Head (left side): No submandibular, no tonsillar and no occipital adenopathy present.    She has no cervical adenopathy.  Neurological: She is alert.  Skin: No abrasion, no petechiae and no rash noted. Rash is not papular, not vesicular and not urticarial. No erythema. No pallor.  No eczematous lesions noted.   Psychiatric: She has a normal mood and affect.     Diagnostic studies: none      Malachi Bonds, MD [facility]

## 2019-08-11 NOTE — Patient Instructions (Addendum)
1. Vaccine reaction - I think we can schedule you for a Tetanus and a Pneumovax challenge. - Make these appointments on your way out. - These will be two hours long. - I do not think that testing is needed since there is no history of anaphylaxis.   2. Return for VACCINE CHALLENGES.    Please inform us of any Emergency Department visits, hospitalizations, or changes in symptoms. Call us before going to the ED for breathing or allergy symptoms since we might be able to fit you in for a sick visit. Feel free to contact us anytime with any questions, problems, or concerns.  It was a pleasure to meet you today!  Websites that have reliable patient information: 1. American Academy of Asthma, Allergy, and Immunology: www.aaaai.org 2. Food Allergy Research and Education (FARE): foodallergy.org 3. Mothers of Asthmatics: http://www.asthmacommunitynetwork.org 4. American College of Allergy, Asthma, and Immunology: www.acaai.org   COVID-19 Vaccine Information can be found at: [email or web address].[email or web address] For questions related to vaccine distribution or appointments, please email [email or web address] or call [phone].     "Like" [place] on Facebook and Instagram for our latest updates!        Make sure you are registered to vote! If you have moved or changed any of your contact information, you will need to get this updated before voting!  In some cases, you MAY be able to register to vote online: [email or web address]

## 2019-08-12 ENCOUNTER — Encounter: Payer: Self-pay | Admitting: Allergy & Immunology

## 2019-08-16 ENCOUNTER — Ambulatory Visit: Payer: Medicare PPO | Admitting: Physical Therapy

## 2019-08-18 ENCOUNTER — Ambulatory Visit: Payer: Medicare PPO | Attending: Internal Medicine

## 2019-08-18 DIAGNOSIS — Z23 Encounter for immunization: Secondary | ICD-10-CM | POA: Insufficient documentation

## 2019-08-18 NOTE — Progress Notes (Signed)
   Covid-19 Vaccination Clinic  Name:  Leah Mathews    MRN: [id number] DOB: 25-Jul-1951  08/18/2019  [name] was observed post Covid-19 immunization for 30 minutes based on pre-vaccination screening without incidence. She was provided with Vaccine Information Sheet and instruction to access the V-Safe system.   [name] was instructed to call 911 with any severe reactions post vaccine: Marland Kitchen Difficulty breathing  . Swelling of your face and throat  . A fast heartbeat  . A bad rash all over your body  . Dizziness and weakness    Immunizations Administered    Name Date Dose VIS Date Route   Pfizer COVID-19 Vaccine 08/18/2019  2:10 PM 0.3 mL 07/08/2019 Intramuscular   Manufacturer: [employer], [employer]   Lot: [id number]   NDC: [address]-1

## 2019-08-19 ENCOUNTER — Encounter: Payer: Medicare PPO | Admitting: Physical Therapy

## 2019-08-23 ENCOUNTER — Encounter: Payer: Medicare PPO | Admitting: Physical Therapy

## 2019-08-25 ENCOUNTER — Encounter: Payer: Medicare PPO | Admitting: Physical Therapy

## 2019-08-31 ENCOUNTER — Encounter: Payer: Medicare PPO | Admitting: Physical Therapy

## 2019-09-02 ENCOUNTER — Encounter: Payer: Medicare PPO | Admitting: Family Medicine

## 2019-09-07 ENCOUNTER — Encounter: Payer: Medicare PPO | Admitting: Physical Therapy

## 2019-09-08 ENCOUNTER — Ambulatory Visit: Payer: Medicare PPO | Attending: Internal Medicine

## 2019-09-08 DIAGNOSIS — Z23 Encounter for immunization: Secondary | ICD-10-CM | POA: Insufficient documentation

## 2019-09-08 NOTE — Progress Notes (Signed)
   Covid-19 Vaccination Clinic  Name:  Leah Mathews    MRN: [id number] DOB: [birth year]  09/08/2019  [name] was observed post Covid-19 immunization for 30 minutes based on pre-vaccination screening without incidence. She was provided with Vaccine Information Sheet and instruction to access the V-Safe system.   [name] was instructed to call 911 with any severe reactions post vaccine: Marland Kitchen Difficulty breathing  . Swelling of your face and throat  . A fast heartbeat  . A bad rash all over your body  . Dizziness and weakness    Immunizations Administered    Name Date Dose VIS Date Route   Pfizer COVID-19 Vaccine 09/08/2019  3:32 PM 0.3 mL 07/08/2019 Intramuscular   Manufacturer: [employer], [employer]   Lot: [id number]   NDC: [address]-3

## 2019-09-14 ENCOUNTER — Encounter: Payer: Medicare PPO | Admitting: Physical Therapy

## 2019-09-21 ENCOUNTER — Other Ambulatory Visit: Payer: Self-pay

## 2019-09-21 ENCOUNTER — Ambulatory Visit: Payer: Medicare PPO | Attending: Family Medicine | Admitting: Physical Therapy

## 2019-09-21 DIAGNOSIS — M62838 Other muscle spasm: Secondary | ICD-10-CM | POA: Diagnosis present

## 2019-09-21 DIAGNOSIS — M25511 Pain in right shoulder: Secondary | ICD-10-CM | POA: Diagnosis not present

## 2019-09-21 DIAGNOSIS — M6281 Muscle weakness (generalized): Secondary | ICD-10-CM | POA: Diagnosis present

## 2019-09-21 NOTE — Addendum Note (Signed)
Addended by: Nathaneil Canary on: 09/21/2019 01:53 PM   Modules accepted: Orders
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
## 2019-10-05 ENCOUNTER — Ambulatory Visit
Admission: RE | Admit: 2019-10-05 | Discharge: 2019-10-05 | Disposition: A | Discharge status: Home / Self Care | Payer: Medicare PPO | Source: Ambulatory Visit | Attending: Family Medicine | Admitting: Family Medicine

## 2019-10-05 ENCOUNTER — Other Ambulatory Visit: Payer: Self-pay

## 2019-10-05 ENCOUNTER — Encounter: Payer: Medicare PPO | Admitting: Physical Therapy

## 2019-10-05 DIAGNOSIS — M8588 Other specified disorders of bone density and structure, other site: Secondary | ICD-10-CM

## 2019-10-06 ENCOUNTER — Other Ambulatory Visit: Payer: Self-pay

## 2019-10-06 ENCOUNTER — Ambulatory Visit: Payer: Medicare PPO | Attending: Family Medicine | Admitting: Physical Therapy

## 2019-10-06 DIAGNOSIS — M25511 Pain in right shoulder: Secondary | ICD-10-CM | POA: Diagnosis not present

## 2019-10-06 DIAGNOSIS — M6281 Muscle weakness (generalized): Secondary | ICD-10-CM | POA: Diagnosis present

## 2019-10-06 DIAGNOSIS — M62838 Other muscle spasm: Secondary | ICD-10-CM | POA: Insufficient documentation

## 2019-10-06 NOTE — Therapy (Signed)
[facility] [address] [place], [place], [address] Phone: [phone]   Fax:  [phone]  Physical Therapy Treatment  Patient Details  Name: Leah Mathews MRN: [id number] Date of Birth: [birth year] Referring Provider (PT): Sela Hilding, MD    Encounter Date: 10/06/2019  PT End of Session - 10/06/19 1445    Visit Number  3    Authorization Type  [employer]    Authorization Time Period  --    Authorization - Number of Visits  12    PT Start Time  5027    PT Stop Time  1440    PT Time Calculation (min)  35 min    Activity Tolerance  Patient tolerated treatment well;No increased pain    Behavior During Therapy  [facility] for tasks assessed/performed       Past Medical History:  Diagnosis Date  . Asthma   . Depression   . Epilepsy ([place])     No past surgical history on file.  There were no vitals filed for this visit.  Subjective Assessment - 10/06/19 1417    Subjective  Pt reports doing her exercises 1x/day. No big changes in Rt shoulder since last visit.  She states she continues to have some discomfort in posterior Rt shoulder with Rt sidelying. She wants to paint in her house.    Currently in Pain?  No/denies    Pain Score  0-No pain   only hurts if trying to lift something.   Pain Location  Shoulder    Pain Orientation  Right         [facility] PT Assessment - 10/06/19 0001      Assessment   Medical Diagnosis  Pain in Rt shoulder     Referring Provider (PT)  Sela Hilding, MD     Onset Date/Surgical Date  06/09/19    Hand Dominance  Right      Strength   Right Shoulder Flexion  4-/5   with pain   Right Shoulder ABduction  4-/5   with pain   Right Shoulder Internal Rotation  5/5    Right Shoulder External Rotation  4+/5       [facility] Adult PT Treatment/Exercise - 10/06/19 0001      Shoulder Exercises: Supine   Horizontal ABduction  Strengthening;Both;10 reps    Theraband Level (Shoulder  Horizontal ABduction)  Level 1 (Yellow)    Diagonals  Right;10 reps    Theraband Level (Shoulder Diagonals)  Level 1 (Yellow)      Shoulder Exercises: Seated   Flexion  Both;5 reps;AROM    Abduction  AROM;Both;5 reps      Shoulder Exercises: Standing   Horizontal ABduction  Strengthening;Both;5 reps    Theraband Level (Shoulder Horizontal ABduction)  Level 1 (Yellow)    Flexion  Right;10 reps    Theraband Level (Shoulder Flexion)  Level 1 (Yellow)   5 reps, yellow too much resistance   Shoulder Flexion Weight (lbs)  1    ABduction  Strengthening;Right;10 reps    Theraband Level (Shoulder ABduction)  Level 1 (Yellow)   5 reps, too much resistance   Shoulder ABduction Weight (lbs)  1    Diagonals  Right;5 reps;Strengthening    Theraband Level (Shoulder Diagonals)  Level 1 (Yellow)    Other Standing Exercises  shoulder rolls       Shoulder Exercises: Stretch   Star Gazer Stretch  2 reps;30 seconds   hands behind head stretch in supine  Other Shoulder Stretches  3 position doorway pec stretch x 15 sec x 2 reps each position,      Other Shoulder Stretches  bilat shoulder ext stretch, holding door frame with straight elbows x 15 sec x 2 reps              PT Education - 10/06/19 1451    Education Details  updated HEP    Person(s) Educated  Patient    Methods  Explanation;Demonstration;Handout;Tactile cues;Verbal cues    Comprehension  Verbalized understanding;Returned demonstration       PT Short Term Goals - 09/21/19 0942      PT SHORT TERM GOAL #1   Title  Pt will be independent with her initial HEP to increase shoulder strength and decrease pain with daily activity.    Time  3    Period  Weeks    Status  Achieved    Target Date  08/31/19        PT Long Term Goals - 10/06/19 1416      PT LONG TERM GOAL #1   Title  Pt will have greater than 4/5 MMT strength of the Rt UE without pain which will improve her efficiency with daily activity.    Time  6    Period   Weeks    Status  Partially Met   Abduction 4/5, all others > 4/5     PT LONG TERM GOAL #2   Title  Pt will have improved Rt UE strength evident by her ability to lift her water pitcher out of the refrigerator without the need for Lt UE support.    Time  6    Period  Weeks    Status  On-going      PT LONG TERM GOAL #3   Title  Pt will have no more than 33% limitation on her FOTO outcome measure to reflect improvements in functional use of the Rt UE throughout the day.    Time  6    Period  Weeks    Status  On-going   41% limited     PT LONG TERM GOAL #4   Title  Pt will report atleast 50% improvement in her pain at night which will improve her sleep quality.    Time  6    Status  Achieved            Plan - 10/06/19 1448    Clinical Impression Statement  Pt continues with some strength deficits in Rt shoulder.  She was able to tolerate new exercises with light resistance with some fatigue and mild discomfort; reduced with rest.  Pt is making progress towards LTG and will benefit from continued PT intervention to maximize functional mobility.    Personal Factors and Comorbidities  Time since onset of injury/illness/exacerbation    Stability/Clinical Decision Making  Stable/Uncomplicated    Rehab Potential  Good    PT Frequency  1x / week    PT Duration  8 weeks    PT Treatment/Interventions  ADLs/Self Care Home Management;Iontophoresis '4mg'$ /ml Dexamethasone;Therapeutic activities;Therapeutic exercise;Neuromuscular re-education;Patient/family education;Manual techniques;Dry needling;Taping    PT Next Visit Plan  progress strengthening for Rt shoulder.    PT Home Exercise Plan  [id number]    Consulted and Agree with Plan of Care  Patient       Patient will benefit from skilled therapeutic intervention in order to improve the following deficits and impairments:  Impaired UE functional use, Decreased activity tolerance, Increased muscle spasms,  Postural dysfunction, Pain, Decreased  strength  Visit Diagnosis: Right shoulder pain, unspecified chronicity  Muscle weakness (generalized)  Other muscle spasm     Problem List There are no problems to display for this patient.  Kerin Perna, PTA 10/06/19 5:19 PM  [facility] [address] [place], [place], [address] Phone: [phone]   Fax:  [phone]  Name: Leah Mathews MRN: [id number] Date of Birth: 12-08-1950

## 2019-10-06 NOTE — Patient Instructions (Signed)
Access Code: [email or web address]  URL: [email or web address]  Date: 10/06/2019  Prepared by: Mayer Camel   Exercises  Sidelying Shoulder Abduction Palm Forward - 15-20 reps - 1x daily - 7x weekly  Seated Shoulder Flexion - 10 reps - 1 sets - 2x daily - 7x weekly  Seated Shoulder Scaption - 10 reps - 1 sets - 2x daily - 7x weekly  Seated Shoulder Abduction - 10 reps - 1 sets - 2x daily - 7x weekly  Standing Shoulder Single Arm PNF D2 Flexion with Resistance - 10 reps - 1-2 sets - 1x daily - 3x weekly  Seated Single Arm Shoulder Flexion with Dumbbell - 10 reps - 1-2 sets - 1x daily - 3x weekly  Seated Shoulder Abduction with Dumbbells - Thumbs Up - 10 reps - 1-2 sets - 1x daily - 3x weekly  Standing Shoulder Horizontal Abduction with Resistance - 10 reps - 1-2 sets - 1x daily - 3x weekly  Doorway Pec Stretch at 90 Degrees Abduction - 2 reps - 1 sets - 15-20 hold - 1x daily - 7x weekly  Supine Chest Stretch with Elbows Bent - 2 reps - 1 sets - 15-20 hold - 1x daily - 7x weekly

## 2019-10-07 ENCOUNTER — Encounter: Payer: Medicare PPO | Admitting: Family Medicine

## 2019-10-12 ENCOUNTER — Encounter: Payer: Self-pay | Admitting: Physical Therapy

## 2019-10-12 ENCOUNTER — Ambulatory Visit: Payer: Medicare PPO | Admitting: Physical Therapy

## 2019-10-12 ENCOUNTER — Other Ambulatory Visit: Payer: Self-pay

## 2019-10-12 DIAGNOSIS — M25511 Pain in right shoulder: Secondary | ICD-10-CM | POA: Diagnosis not present

## 2019-10-12 DIAGNOSIS — M62838 Other muscle spasm: Secondary | ICD-10-CM

## 2019-10-12 DIAGNOSIS — M6281 Muscle weakness (generalized): Secondary | ICD-10-CM
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
## 2019-10-17 ENCOUNTER — Ambulatory Visit: Payer: Medicare PPO | Admitting: Physical Therapy

## 2019-10-17 ENCOUNTER — Encounter: Payer: Self-pay | Admitting: Physical Therapy

## 2019-10-17 ENCOUNTER — Other Ambulatory Visit: Payer: Self-pay

## 2019-10-17 DIAGNOSIS — M25511 Pain in right shoulder: Secondary | ICD-10-CM

## 2019-10-17 DIAGNOSIS — M6281 Muscle weakness (generalized): Secondary | ICD-10-CM

## 2019-10-17 DIAGNOSIS — M62838 Other muscle spasm: Secondary | ICD-10-CM

## 2019-10-17 NOTE — Therapy (Signed)
[facility] [address] [place], [place], [address] Phone: [phone]   Fax:  [phone]  Physical Therapy Treatment  Patient Details  Name: Leah Mathews MRN: [id number] Date of Birth: May 26, 1951 Referring Provider (PT): Sela Hilding, MD    Encounter Date: 10/17/2019  PT End of Session - 10/17/19 1110    Visit Number  5    Date for PT Re-Evaluation  11/16/19    Authorization Type  [employer]    Authorization Time Period  09/21/19-11/16/19    Authorization - Visit Number  5    Authorization - Number of Visits  12    PT Start Time  0932    PT Stop Time  1015    PT Time Calculation (min)  43 min    Activity Tolerance  Patient tolerated treatment well    Behavior During Therapy  [facility] for tasks assessed/performed       Past Medical History:  Diagnosis Date  . Asthma   . Depression   . Epilepsy ([place])     History reviewed. No pertinent surgical history.  There were no vitals filed for this visit.                    [facility] Adult PT Treatment/Exercise - 10/17/19 0001      Shoulder Exercises: Seated   Other Seated Exercises  2# 3 way raise 10x    1# for RTUE shld abduction   Other Seated Exercises  RT scalene & upper trap stretch 2x 20 sec each      Shoulder Exercises: Standing   Extension  Strengthening;Both;15 reps;Theraband    Theraband Level (Shoulder Extension)  Level 2 (Red)    Row  Strengthening;Both;15 reps;Theraband    Theraband Level (Shoulder Row)  Level 2 (Red)      Shoulder Exercises: Pulleys   Flexion  3 minutes      Shoulder Exercises: ROM/Strengthening   UBE (Upper Arm Bike)  L 1 3x3 with discussion of current status      Manual Therapy   Soft tissue mobilization  Bil uppertraps, trigger point release Bil                PT Short Term Goals - 09/21/19 0942      PT SHORT TERM GOAL #1   Title  Pt will be independent with her initial HEP to increase shoulder  strength and decrease pain with daily activity.    Time  3    Period  Weeks    Status  Achieved    Target Date  08/31/19        PT Long Term Goals - 10/06/19 1416      PT LONG TERM GOAL #1   Title  Pt will have greater than 4/5 MMT strength of the Rt UE without pain which will improve her efficiency with daily activity.    Time  6    Period  Weeks    Status  Partially Met   Abduction 4/5, all others > 4/5     PT LONG TERM GOAL #2   Title  Pt will have improved Rt UE strength evident by her ability to lift her water pitcher out of the refrigerator without the need for Lt UE support.    Time  6    Period  Weeks    Status  On-going      PT LONG TERM GOAL #3   Title  Pt will have no  more than 33% limitation on her FOTO outcome measure to reflect improvements in functional use of the Rt UE throughout the day.    Time  6    Period  Weeks    Status  On-going   41% limited     PT LONG TERM GOAL #4   Title  Pt will report atleast 50% improvement in her pain at night which will improve her sleep quality.    Time  6    Status  Achieved            Plan - 10/17/19 0951    Clinical Impression Statement  Pt reports her shoulder(s) are feeling better this past week. She felt working on her upper traps was helpful and she has been stretching them more at home. HEP band was updated to red to advance her resistance/strength. Pt continues to have multiple active trigger points in her traps and would like to try the dry needling on the 14th.    Personal Factors and Comorbidities  Time since onset of injury/illness/exacerbation    Examination-Activity Limitations  Lift    Stability/Clinical Decision Making  Stable/Uncomplicated    Rehab Potential  Good    PT Frequency  1x / week    PT Duration  8 weeks    PT Treatment/Interventions  ADLs/Self Care Home Management;Iontophoresis '4mg'$ /ml Dexamethasone;Therapeutic activities;Therapeutic exercise;Neuromuscular re-education;Patient/family  education;Manual techniques;Dry needling;Taping    PT Next Visit Plan  progress strengthening for Rt shoulder. Manual to upper traps and cervical ROM    PT Home Exercise Plan  [id number]    Consulted and Agree with Plan of Care  Patient       Patient will benefit from skilled therapeutic intervention in order to improve the following deficits and impairments:  Impaired UE functional use, Decreased activity tolerance, Increased muscle spasms, Postural dysfunction, Pain, Decreased strength  Visit Diagnosis: Right shoulder pain, unspecified chronicity  Muscle weakness (generalized)  Other muscle spasm     Problem List There are no problems to display for this patient.   Leah Mathews, PTA 10/17/2019, 11:11 AM  [facility] [address], [place], [place], [address] Phone: [phone]   Fax:  [phone]  Name: Leah Mathews MRN: [id number] Date of Birth: Aug 31, 1950

## 2019-10-19 ENCOUNTER — Encounter: Payer: Medicare PPO | Admitting: Physical Therapy

## 2019-10-26 ENCOUNTER — Encounter: Payer: Self-pay | Admitting: Physical Therapy

## 2019-10-26 ENCOUNTER — Ambulatory Visit: Payer: Medicare PPO | Admitting: Physical Therapy

## 2019-10-26 ENCOUNTER — Other Ambulatory Visit: Payer: Self-pay

## 2019-10-26 DIAGNOSIS — M62838 Other muscle spasm: Secondary | ICD-10-CM

## 2019-10-26 DIAGNOSIS — M25511 Pain in right shoulder: Secondary | ICD-10-CM | POA: Diagnosis not present

## 2019-10-26 DIAGNOSIS — M6281 Muscle weakness (generalized): Secondary | ICD-10-CM
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
## 2019-11-02 ENCOUNTER — Encounter: Payer: Self-pay | Admitting: Physical Therapy

## 2019-11-02 ENCOUNTER — Ambulatory Visit: Payer: Medicare PPO | Attending: Family Medicine | Admitting: Physical Therapy

## 2019-11-02 ENCOUNTER — Other Ambulatory Visit: Payer: Self-pay

## 2019-11-02 DIAGNOSIS — M62838 Other muscle spasm: Secondary | ICD-10-CM | POA: Insufficient documentation

## 2019-11-02 DIAGNOSIS — M25511 Pain in right shoulder: Secondary | ICD-10-CM | POA: Diagnosis present

## 2019-11-02 DIAGNOSIS — M6281 Muscle weakness (generalized): Secondary | ICD-10-CM | POA: Diagnosis present

## 2019-11-02 NOTE — Patient Instructions (Signed)
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
## 2019-11-09 ENCOUNTER — Encounter: Payer: Self-pay | Admitting: Physical Therapy

## 2019-11-09 ENCOUNTER — Ambulatory Visit: Payer: Medicare PPO | Admitting: Physical Therapy

## 2019-11-09 ENCOUNTER — Other Ambulatory Visit: Payer: Self-pay

## 2019-11-09 DIAGNOSIS — M62838 Other muscle spasm: Secondary | ICD-10-CM

## 2019-11-09 DIAGNOSIS — M25511 Pain in right shoulder: Secondary | ICD-10-CM

## 2019-11-09 DIAGNOSIS — M6281 Muscle weakness (generalized): Secondary | ICD-10-CM
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
## 2019-11-25 ENCOUNTER — Other Ambulatory Visit: Payer: Self-pay

## 2019-11-25 ENCOUNTER — Telehealth: Payer: Self-pay | Admitting: *Deleted

## 2019-11-25 ENCOUNTER — Ambulatory Visit: Payer: Medicare PPO | Admitting: Family Medicine

## 2019-11-25 ENCOUNTER — Encounter: Payer: Self-pay | Admitting: Family Medicine

## 2019-11-25 VITALS — BP 116/68 | HR 58 | Temp 97.5°F | Resp 16

## 2019-11-25 DIAGNOSIS — T50Z95D Adverse effect of other vaccines and biological substances, subsequent encounter: Secondary | ICD-10-CM | POA: Insufficient documentation

## 2019-11-25 DIAGNOSIS — R21 Rash and other nonspecific skin eruption: Secondary | ICD-10-CM | POA: Diagnosis not present

## 2019-11-25 DIAGNOSIS — Z23 Encounter for immunization: Secondary | ICD-10-CM | POA: Diagnosis not present

## 2019-11-25 DIAGNOSIS — T50A95D Adverse effect of other bacterial vaccines, subsequent encounter: Secondary | ICD-10-CM

## 2019-11-25 NOTE — Telephone Encounter (Signed)
Called to check on patient from this mornings challenge per Thurston Hole. Patient is doing great-no issues at all.

## 2019-11-25 NOTE — Progress Notes (Addendum)
[address] [place] [address] Dept: [phone]  FOLLOW UP NOTE  Patient ID: Leah Mathews, female    DOB: [birth year]  Age: 69 y.o. MRN: [id number] Date of Office Visit: 11/25/2019  Assessment  Chief Complaint: Food/Drug Challenge (Pneumovax)  HPI Leah Mathews is a 69 year old female who presents to the clinic today for Pneumovax vaccine challenge.  She was last seen in the clinic on 08/11/2019 by Dr. Ernst Bowler for evaluation of vaccine reaction to tetanus that occurred about 30 years.  She vaguely remembers having a rash that may have occurred several days after the tetanus vaccination.  She denies gastrointestinal or cardiopulmonary symptoms occurring with this rash.  At today's visit, she reports that she is feeling well and has not taken any antihistamines for the last 3 days.  Her current medications are listed in the chart.   Drug Allergies:  Allergies  Allergen Reactions   Ciprofloxacin Rash   Sulfa Antibiotics Rash   Tetanus Toxoid Rash   Amitriptyline Other (See Comments)    Depression   Ciprofloxacin Rash   Sulfur Rash   Tetanus Toxoids Rash    Physical Exam: BP 116/68   Pulse (!) 58   Temp (!) 97.5 F (36.4 C) (Temporal)   Resp 16   SpO2 96%    Physical Exam Vitals reviewed.  Constitutional:      Appearance: Normal appearance.  HENT:     Head: Normocephalic and atraumatic.     Right Ear: Tympanic membrane normal.     Left Ear: Tympanic membrane normal.     Nose:     Comments: Bilateral nares normal.  Pharynx normal.  Ears normal.  Eyes normal.    Mouth/Throat:     Pharynx: Oropharynx is clear.  Eyes:     Conjunctiva/sclera: Conjunctivae normal.  Cardiovascular:     Rate and Rhythm: Normal rate and regular rhythm.     Heart sounds: Normal heart sounds. No murmur.  Pulmonary:     Effort: Pulmonary effort is normal.     Breath sounds: Normal breath sounds.     Comments: Lungs clear to auscultation Musculoskeletal:        General:  Normal range of motion.     Cervical back: Normal range of motion and neck supple.  Skin:    General: Skin is warm and dry.  Neurological:     Mental Status: She is alert and oriented to person, place, and time.  Psychiatric:        Mood and Affect: Mood normal.        Behavior: Behavior normal.        Thought Content: Thought content normal.        Judgment: Judgment normal.     Procedure note: Open graded Pneumovax challenge: The patient was able to tolerate the challenge today without adverse signs or symptoms. Vital signs were stable throughout the challenge and observation period. She received two doses separated by 30 minutes, each of which was separated by vitals and a brief physical exam. She received the following doses: 10% and 90% of total 0.60ml dose.  She was monitored for 60 minutes following the last dose.   The patient was able to tolerate the challenge today without adverse signs or symptoms. Therefore, she has the same risk of systemic reaction associated with  Pneumovax 23  as the general population.   Assessment and Plan: 1. Need for prophylactic vaccination against Streptococcus pneumoniae (pneumococcus)   2. Vaccine or biological substance causing adverse  effect in therapeutic use, subsequent encounter     Patient Instructions  Pneumovax injection challenge Leah Mathews was able to tolerate the Pneumovax injection challenge today at the office without adverse signs or symptoms of an allergic reaction. Therefore, she has the same risk of systemic reaction associated with the she as the general population.  - Monitor for allergic symptoms such as rash, wheezing, diarrhea, swelling, and vomiting for the next 24 hours. If severe symptoms occur, treat with EpiPen injection and call 911. For less severe symptoms treat with Benadryl 50 mg every 4 hours and call the clinic.   Call the clinic if this treatment plan is not working well for you  Follow up in 1 year or sooner  if needed.   Return in about 1 year (around 11/24/2020), or if symptoms worsen or fail to improve.    Thank you for the opportunity to care for this patient.  Please do not hesitate to contact me with questions.  Thermon Leyland, FNP [facility]  Addendum: I reviewed the Nurse Practitioner's note and agree with the documented findings and plan of care. We discussed the patient and developed a plan concurrently.   She tolerated challenge well today and has now received full dose of Pneumovax vaccine.    Margo Aye, MD [facility]

## 2019-11-25 NOTE — Patient Instructions (Addendum)
Pneumovax injection challenge Leah Mathews was able to tolerate the Pneumovax injection challenge today at the office without adverse signs or symptoms of an allergic reaction. Therefore, she has the same risk of systemic reaction associated with the she as the general population.  - Do not give any Pneumovax injections for the next 24 hours. - Monitor for allergic symptoms such as rash, wheezing, diarrhea, swelling, and vomiting for the next 24 hours. If severe symptoms occur, treat with EpiPen injection and call 911. For less severe symptoms treat with Benadryl 50 mg every 4 hours and call the clinic.   Call the clinic if this treatment plan is not working well for you  Follow up in 1 year or sooner if needed.

## 2019-11-30 ENCOUNTER — Ambulatory Visit: Payer: Medicare PPO | Attending: Family Medicine | Admitting: Physical Therapy

## 2019-11-30 ENCOUNTER — Other Ambulatory Visit: Payer: Self-pay

## 2019-11-30 ENCOUNTER — Encounter: Payer: Self-pay | Admitting: Physical Therapy

## 2019-11-30 DIAGNOSIS — M25511 Pain in right shoulder: Secondary | ICD-10-CM | POA: Diagnosis present

## 2019-11-30 DIAGNOSIS — M62838 Other muscle spasm: Secondary | ICD-10-CM | POA: Insufficient documentation

## 2019-11-30 DIAGNOSIS — M6281 Muscle weakness (generalized): Secondary | ICD-10-CM | POA: Insufficient documentation
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
## 2019-12-07 ENCOUNTER — Ambulatory Visit: Payer: Medicare PPO | Admitting: Physical Therapy

## 2019-12-07 ENCOUNTER — Other Ambulatory Visit: Payer: Self-pay

## 2019-12-07 ENCOUNTER — Encounter: Payer: Self-pay | Admitting: Physical Therapy

## 2019-12-07 DIAGNOSIS — M25511 Pain in right shoulder: Secondary | ICD-10-CM | POA: Diagnosis not present

## 2019-12-07 DIAGNOSIS — M6281 Muscle weakness (generalized): Secondary | ICD-10-CM
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
## 2019-12-07 NOTE — Patient Instructions (Signed)
Access Code: [email or web address] URL: [email or web address]: 05/12/2021Prepared by: Loistine Simas BeuhringExercises  Sidelying Shoulder Abduction Palm Forward - 1 x daily - 7 x weekly - 15-20 reps  Seated Shoulder Flexion - 2 x daily - 7 x weekly - 10 reps - 1 sets  Seated Shoulder Scaption - 2 x daily - 7 x weekly - 10 reps - 1 sets  Seated Shoulder Abduction - 2 x daily - 7 x weekly - 10 reps - 1 sets  Standing Shoulder Single Arm PNF D2 Flexion with Resistance - 1 x daily - 3 x weekly - 10 reps - 1-2 sets  Seated Single Arm Shoulder Flexion with Dumbbell - 1 x daily - 3 x weekly - 10 reps - 1-2 sets  Seated Shoulder Abduction with Dumbbells - Thumbs Up - 1 x daily - 3 x weekly - 10 reps - 1-2 sets  Standing Shoulder Horizontal Abduction with Resistance - 1 x daily - 3 x weekly - 10 reps - 1-2 sets  Doorway Pec Stretch at 90 Degrees Abduction - 1 x daily - 7 x weekly - 2 reps - 1 sets - 15-20 hold  Supine Chest Stretch with Elbows Bent - 1 x daily - 7 x weekly - 2 reps - 1 sets - 15-20 hold  Seated Cervical Sidebending AROM - 3 x daily - 7 x weekly - 1 sets - 10 reps  Shoulder External Rotation with Anchored Resistance - 1 x daily - 7 x weekly - 3 sets - 10 reps  Standing Shoulder Extension with Resistance - 1 x daily - 7 x weekly - 3 sets - 5 reps - 5 hold  Standing Row with Anchored Resistance - 1 x daily - 7 x weekly - 2 sets - 20 reps - 3 hold

## 2019-12-14 ENCOUNTER — Encounter: Payer: Self-pay | Admitting: Physical Therapy

## 2019-12-14 ENCOUNTER — Ambulatory Visit: Payer: Medicare PPO | Admitting: Physical Therapy

## 2019-12-14 ENCOUNTER — Other Ambulatory Visit: Payer: Self-pay

## 2019-12-14 DIAGNOSIS — M25511 Pain in right shoulder: Secondary | ICD-10-CM | POA: Diagnosis not present

## 2019-12-14 DIAGNOSIS — M62838 Other muscle spasm: Secondary | ICD-10-CM

## 2019-12-14 DIAGNOSIS — M6281 Muscle weakness (generalized): Secondary | ICD-10-CM
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
## 2019-12-21 ENCOUNTER — Ambulatory Visit: Payer: Medicare PPO | Admitting: Physical Therapy

## 2019-12-21 ENCOUNTER — Other Ambulatory Visit: Payer: Self-pay

## 2019-12-21 ENCOUNTER — Encounter: Payer: Self-pay | Admitting: Physical Therapy

## 2019-12-21 DIAGNOSIS — M25511 Pain in right shoulder: Secondary | ICD-10-CM | POA: Diagnosis not present

## 2019-12-21 DIAGNOSIS — M62838 Other muscle spasm: Secondary | ICD-10-CM

## 2019-12-21 DIAGNOSIS — M6281 Muscle weakness (generalized): Secondary | ICD-10-CM
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
## 2019-12-27 ENCOUNTER — Encounter: Payer: Self-pay | Admitting: Physical Therapy

## 2019-12-27 ENCOUNTER — Ambulatory Visit: Payer: Medicare PPO | Attending: Family Medicine | Admitting: Physical Therapy

## 2019-12-27 ENCOUNTER — Other Ambulatory Visit: Payer: Self-pay

## 2019-12-27 DIAGNOSIS — M25511 Pain in right shoulder: Secondary | ICD-10-CM | POA: Diagnosis present

## 2019-12-27 DIAGNOSIS — M6281 Muscle weakness (generalized): Secondary | ICD-10-CM | POA: Diagnosis present
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
## 2020-01-03 ENCOUNTER — Encounter: Payer: Medicare PPO | Admitting: Physical Therapy

## 2020-06-27 ENCOUNTER — Other Ambulatory Visit: Payer: Self-pay

## 2020-06-27 ENCOUNTER — Ambulatory Visit
Admission: RE | Admit: 2020-06-27 | Discharge: 2020-06-27 | Disposition: A | Discharge status: Home / Self Care | Payer: Medicare PPO | Source: Ambulatory Visit | Attending: Family Medicine | Admitting: Family Medicine

## 2020-06-27 ENCOUNTER — Other Ambulatory Visit: Payer: Self-pay | Admitting: Family Medicine

## 2020-06-27 DIAGNOSIS — R1032 Left lower quadrant pain: Secondary | ICD-10-CM

## 2020-09-10 DIAGNOSIS — E538 Deficiency of other specified B group vitamins: Secondary | ICD-10-CM | POA: Diagnosis not present

## 2020-09-17 DIAGNOSIS — E538 Deficiency of other specified B group vitamins: Secondary | ICD-10-CM | POA: Diagnosis not present

## 2020-09-21 DIAGNOSIS — M170 Bilateral primary osteoarthritis of knee: Secondary | ICD-10-CM | POA: Diagnosis not present

## 2020-09-24 DIAGNOSIS — E538 Deficiency of other specified B group vitamins: Secondary | ICD-10-CM | POA: Diagnosis not present

## 2020-10-01 DIAGNOSIS — E538 Deficiency of other specified B group vitamins: Secondary | ICD-10-CM | POA: Diagnosis not present

## 2020-10-05 DIAGNOSIS — M25561 Pain in right knee: Secondary | ICD-10-CM | POA: Diagnosis not present

## 2020-10-05 DIAGNOSIS — M25562 Pain in left knee: Secondary | ICD-10-CM | POA: Diagnosis not present

## 2020-10-16 DIAGNOSIS — M25562 Pain in left knee: Secondary | ICD-10-CM | POA: Diagnosis not present

## 2020-10-16 DIAGNOSIS — M25561 Pain in right knee: Secondary | ICD-10-CM | POA: Diagnosis not present

## 2020-10-19 DIAGNOSIS — L219 Seborrheic dermatitis, unspecified: Secondary | ICD-10-CM | POA: Diagnosis not present

## 2020-10-19 DIAGNOSIS — D2371 Other benign neoplasm of skin of right lower limb, including hip: Secondary | ICD-10-CM | POA: Diagnosis not present

## 2020-10-19 DIAGNOSIS — Z85828 Personal history of other malignant neoplasm of skin: Secondary | ICD-10-CM | POA: Diagnosis not present

## 2020-10-19 DIAGNOSIS — L821 Other seborrheic keratosis: Secondary | ICD-10-CM | POA: Diagnosis not present

## 2020-10-19 DIAGNOSIS — L578 Other skin changes due to chronic exposure to nonionizing radiation: Secondary | ICD-10-CM | POA: Diagnosis not present

## 2020-10-23 DIAGNOSIS — Z961 Presence of intraocular lens: Secondary | ICD-10-CM | POA: Diagnosis not present

## 2020-10-23 DIAGNOSIS — H524 Presbyopia: Secondary | ICD-10-CM | POA: Diagnosis not present

## 2020-10-29 DIAGNOSIS — E538 Deficiency of other specified B group vitamins: Secondary | ICD-10-CM | POA: Diagnosis not present

## 2020-11-02 DIAGNOSIS — M25562 Pain in left knee: Secondary | ICD-10-CM | POA: Diagnosis not present

## 2020-11-07 DIAGNOSIS — M25562 Pain in left knee: Secondary | ICD-10-CM | POA: Diagnosis not present

## 2020-11-26 DIAGNOSIS — E538 Deficiency of other specified B group vitamins: Secondary | ICD-10-CM | POA: Diagnosis not present

## 2021-01-09 DIAGNOSIS — R29898 Other symptoms and signs involving the musculoskeletal system: Secondary | ICD-10-CM | POA: Diagnosis not present

## 2021-01-09 DIAGNOSIS — Z79899 Other long term (current) drug therapy: Secondary | ICD-10-CM | POA: Diagnosis not present

## 2021-01-09 DIAGNOSIS — F3341 Major depressive disorder, recurrent, in partial remission: Secondary | ICD-10-CM | POA: Diagnosis not present

## 2021-01-09 DIAGNOSIS — E538 Deficiency of other specified B group vitamins: Secondary | ICD-10-CM | POA: Diagnosis not present

## 2021-01-09 DIAGNOSIS — M170 Bilateral primary osteoarthritis of knee: Secondary | ICD-10-CM | POA: Diagnosis not present

## 2021-01-10 ENCOUNTER — Other Ambulatory Visit: Payer: Self-pay | Admitting: Family Medicine

## 2021-01-10 DIAGNOSIS — R29898 Other symptoms and signs involving the musculoskeletal system: Secondary | ICD-10-CM

## 2021-01-15 DIAGNOSIS — E538 Deficiency of other specified B group vitamins: Secondary | ICD-10-CM | POA: Diagnosis not present

## 2021-01-15 DIAGNOSIS — Z79899 Other long term (current) drug therapy: Secondary | ICD-10-CM | POA: Diagnosis not present

## 2021-01-17 ENCOUNTER — Ambulatory Visit
Admission: RE | Admit: 2021-01-17 | Discharge: 2021-01-17 | Disposition: A | Discharge status: Home / Self Care | Payer: Medicare PPO | Source: Ambulatory Visit | Attending: Family Medicine | Admitting: Family Medicine

## 2021-01-17 DIAGNOSIS — R29898 Other symptoms and signs involving the musculoskeletal system: Secondary | ICD-10-CM

## 2021-01-17 DIAGNOSIS — M79605 Pain in left leg: Secondary | ICD-10-CM | POA: Diagnosis not present

## 2021-01-17 IMAGING — CT CT ABD-PELV W/O CM
1 of 2 series · 14 of 32 positions shown, 19 images · non-contrast
Comparison: None.

CLINICAL DATA: 69-year-old female with left lower quadrant
abdominal pain.

EXAM:
CT ABDOMEN AND PELVIS WITHOUT CONTRAST
TECHNIQUE: Multidetector CT imaging of the abdomen and pelvis was performed
following the standard protocol without IV contrast.

[Series 2: abd/pelvis w/(date) · axial · 0.67mm/px · z∈[-438,-43]mm · 14 of 91 slices shown, 19 images]
[im 6/91  soft-tissue]
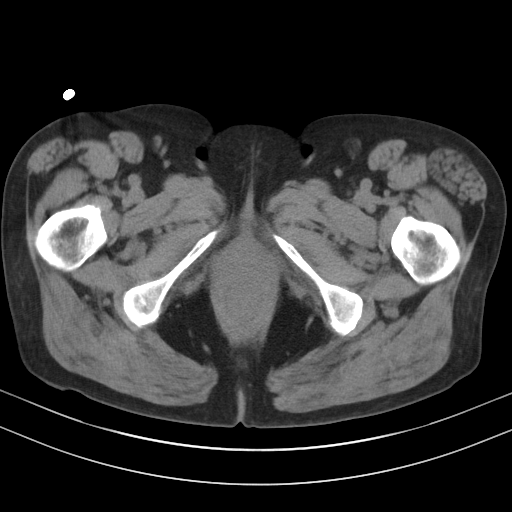
[im 6/91  bone]
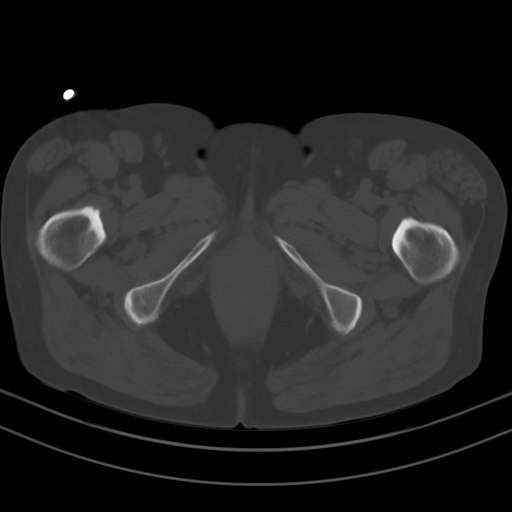
[im 11/91  soft-tissue]
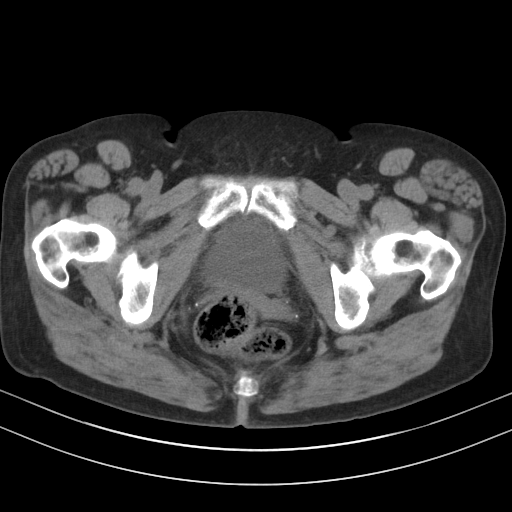
[im 22/91  soft-tissue]
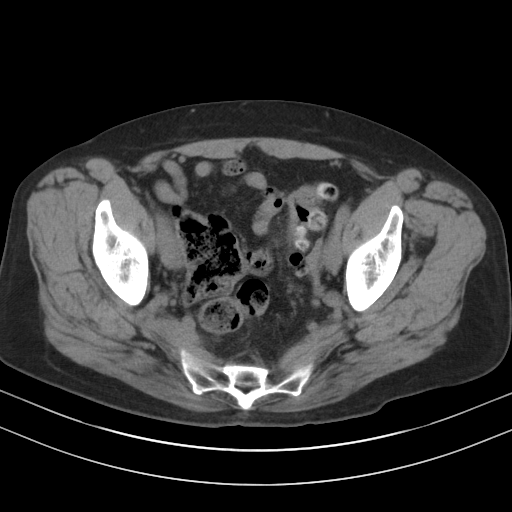
[im 27/91  soft-tissue]
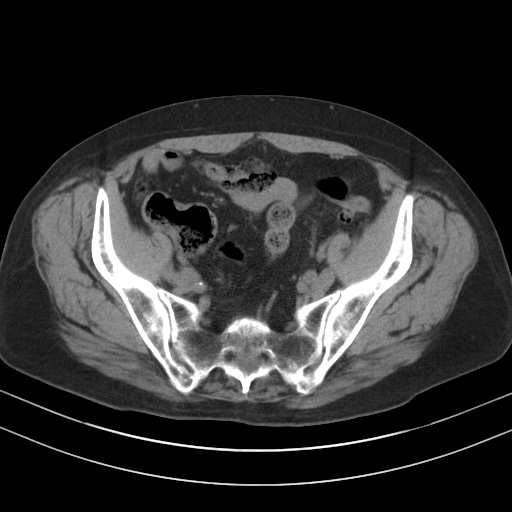
[im 32/91  soft-tissue]
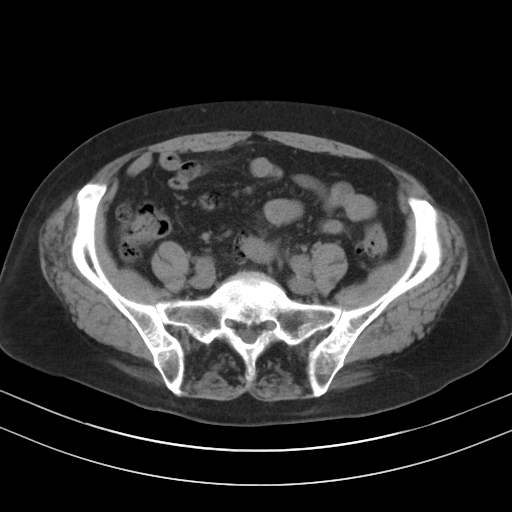
[im 38/91  soft-tissue]
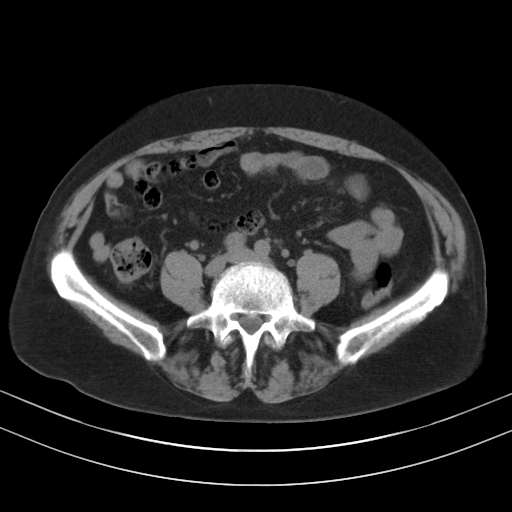
[im 48/91  soft-tissue]
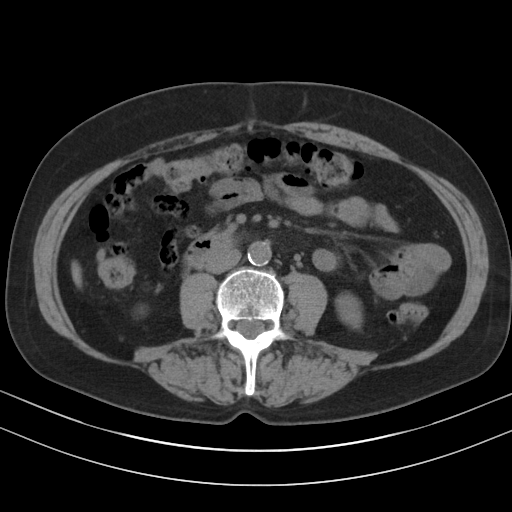
[im 53/91  soft-tissue]
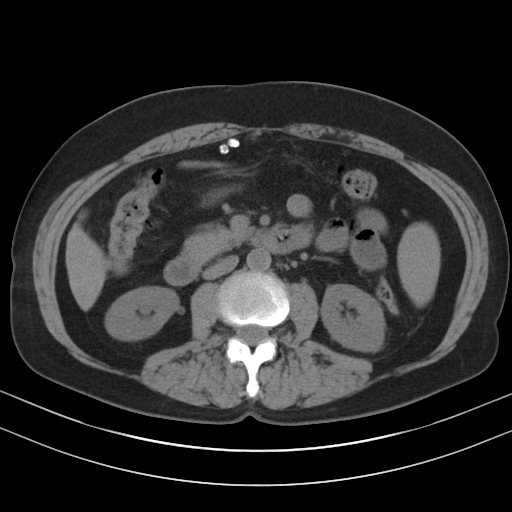
[im 59/91  soft-tissue]
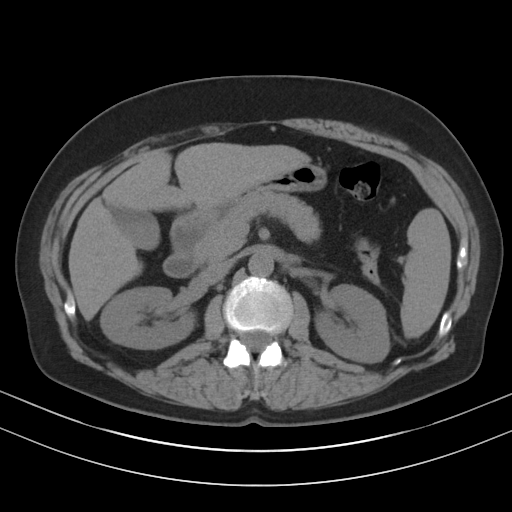
[im 59/91  bone]
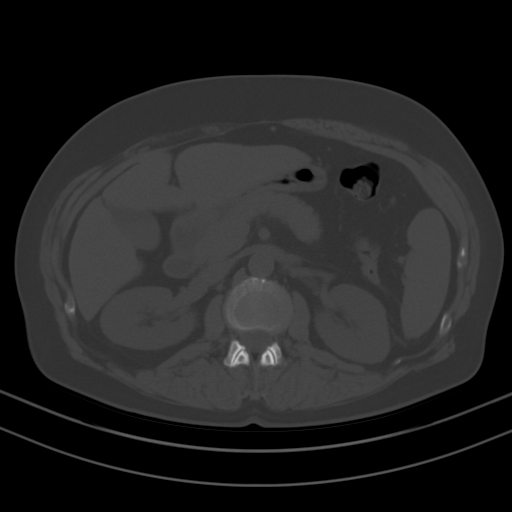
[im 64/91  soft-tissue]
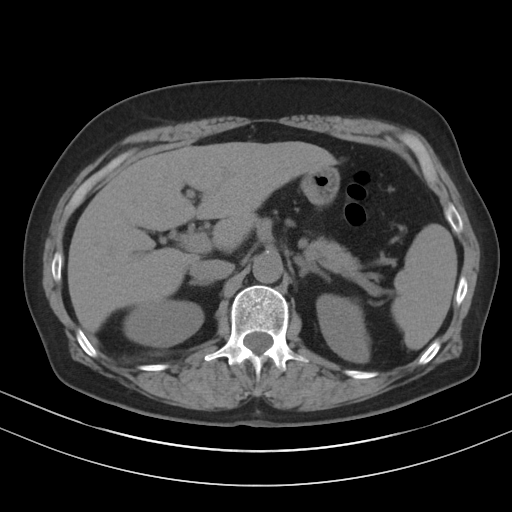
[im 69/91  soft-tissue]
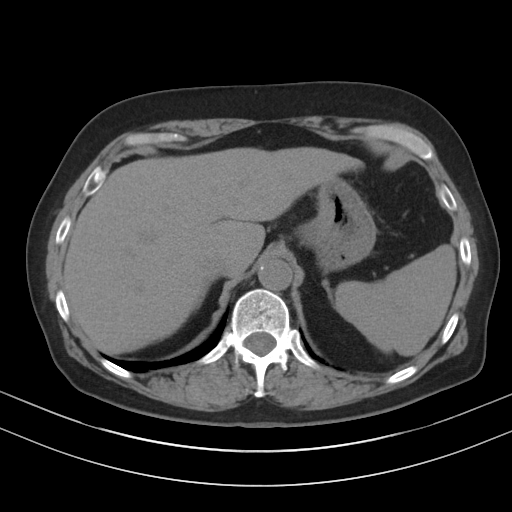
[im 69/91  lung]
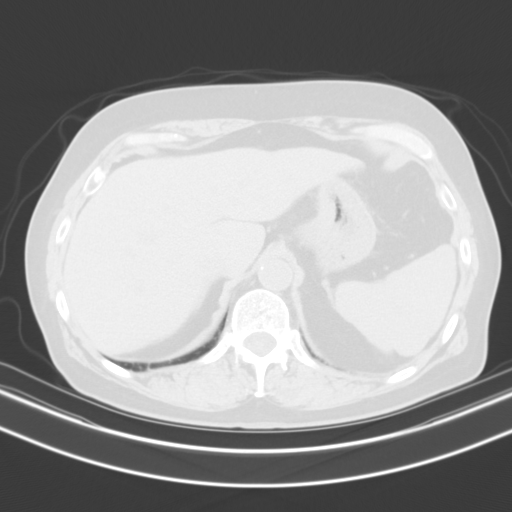
[im 75/91  lung]
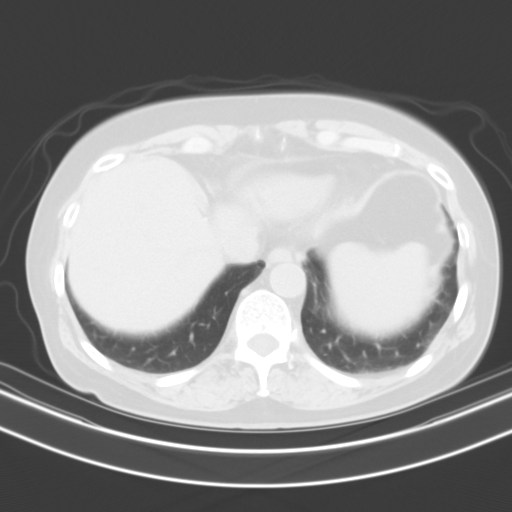
[im 80/91  soft-tissue]
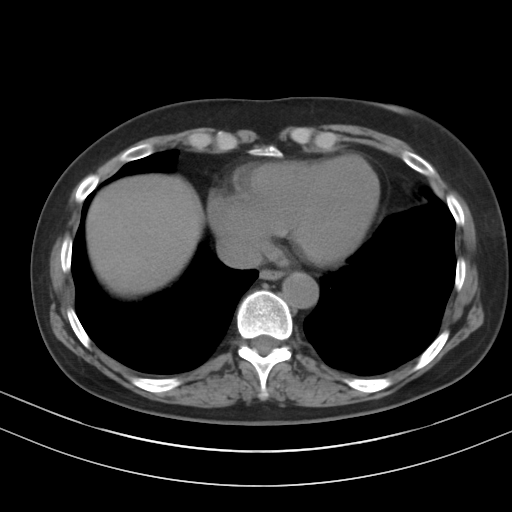
[im 80/91  lung]
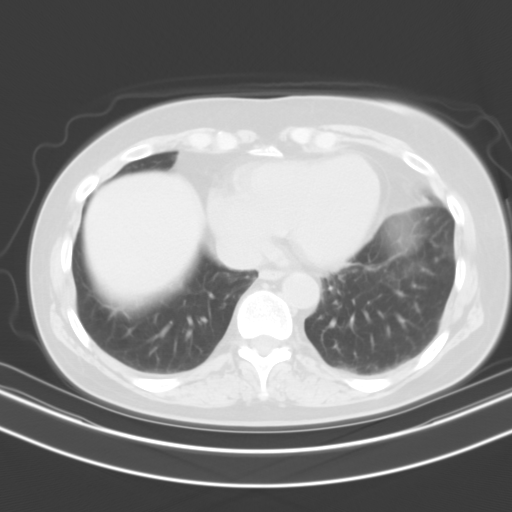
[im 85/91  soft-tissue]
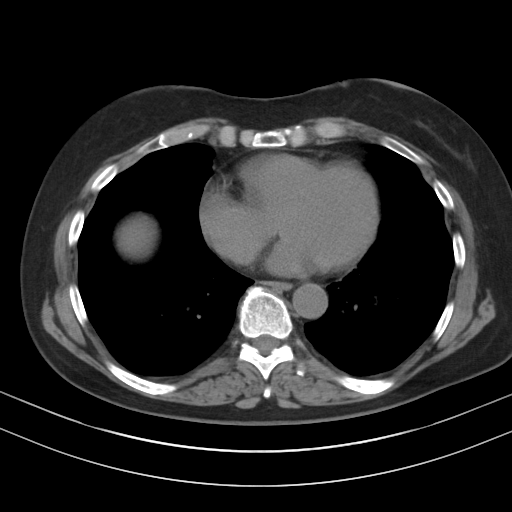
[im 85/91  lung]
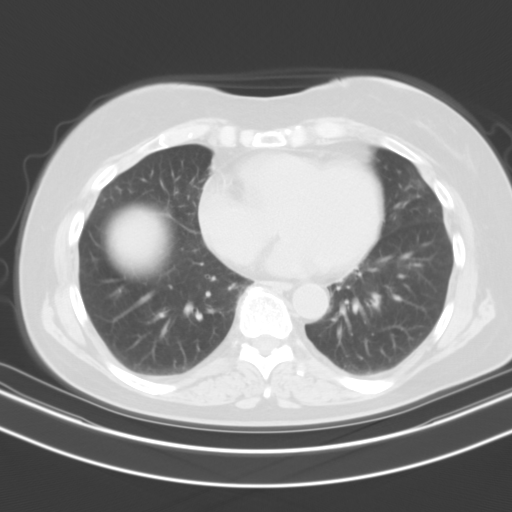

[14 of 32 positions shown; findings below may reference images not displayed]

FINDINGS: Evaluation of this exam is limited in the absence of intravenous
contrast.

Lower chest: The visualized lung bases are clear. Coronary vascular
calcifications noted.

No intra-abdominal free air or free fluid.

Hepatobiliary: The liver is unremarkable. No intrahepatic biliary
ductal dilatation. No calcified gallstone or pericholecystic fluid.

Pancreas: Unremarkable. No pancreatic ductal dilatation or
surrounding inflammatory changes.

Spleen: Normal in size without focal abnormality.

Adrenals/Urinary Tract: The adrenal glands unremarkable. The
kidneys, visualized ureters, and urinary bladder appear
unremarkable.

Stomach/Bowel: There is sigmoid diverticulosis. There is mild
inflammatory changes and stranding adjacent to the sigmoid
diverticula, which although may be chronic, likely represent mild
acute diverticulitis. No diverticular abscess or perforation.
Additional scattered colonic diverticula noted. There is no bowel
obstruction. The appendix is normal.

Vascular/Lymphatic: Mild aortoiliac atherosclerotic disease. The IVC
is unremarkable. No portal venous gas. There is no adenopathy.

Reproductive: The uterus is anteverted and grossly unremarkable. No
adnexal masses.

Other: Small fat containing umbilical hernia.

Musculoskeletal: Mild degenerative changes at L4-5. No acute osseous
pathology.
IMPRESSION: Probable mild acute sigmoid diverticulitis. No diverticular abscess
or perforation.

## 2021-07-25 DIAGNOSIS — F419 Anxiety disorder, unspecified: Secondary | ICD-10-CM | POA: Diagnosis not present

## 2021-07-25 DIAGNOSIS — M792 Neuralgia and neuritis, unspecified: Secondary | ICD-10-CM | POA: Diagnosis not present

## 2021-07-25 DIAGNOSIS — Z1211 Encounter for screening for malignant neoplasm of colon: Secondary | ICD-10-CM | POA: Diagnosis not present

## 2021-07-25 DIAGNOSIS — Z0000 Encounter for general adult medical examination without abnormal findings: Secondary | ICD-10-CM | POA: Diagnosis not present

## 2021-07-25 DIAGNOSIS — Z887 Allergy status to serum and vaccine status: Secondary | ICD-10-CM | POA: Diagnosis not present

## 2021-07-25 DIAGNOSIS — Z79899 Other long term (current) drug therapy: Secondary | ICD-10-CM | POA: Diagnosis not present

## 2021-07-25 DIAGNOSIS — E7800 Pure hypercholesterolemia, unspecified: Secondary | ICD-10-CM | POA: Diagnosis not present

## 2021-07-25 DIAGNOSIS — R5383 Other fatigue: Secondary | ICD-10-CM | POA: Diagnosis not present

## 2021-07-25 DIAGNOSIS — M8589 Other specified disorders of bone density and structure, multiple sites: Secondary | ICD-10-CM | POA: Diagnosis not present

## 2021-07-25 DIAGNOSIS — Z23 Encounter for immunization: Secondary | ICD-10-CM | POA: Diagnosis not present

## 2021-07-25 DIAGNOSIS — E538 Deficiency of other specified B group vitamins: Secondary | ICD-10-CM | POA: Diagnosis not present

## 2021-07-26 ENCOUNTER — Other Ambulatory Visit: Payer: Self-pay

## 2021-07-26 ENCOUNTER — Encounter (HOSPITAL_COMMUNITY): Payer: Self-pay

## 2021-07-26 ENCOUNTER — Emergency Department (HOSPITAL_COMMUNITY)
Admission: EM | Admit: 2021-07-26 | Discharge: 2021-07-26 | Disposition: A | Discharge status: Home / Self Care | Payer: Medicare PPO | Attending: Emergency Medicine | Admitting: Emergency Medicine

## 2021-07-26 DIAGNOSIS — D649 Anemia, unspecified: Secondary | ICD-10-CM | POA: Insufficient documentation

## 2021-07-26 DIAGNOSIS — J45909 Unspecified asthma, uncomplicated: Secondary | ICD-10-CM | POA: Insufficient documentation

## 2021-07-26 DIAGNOSIS — R7989 Other specified abnormal findings of blood chemistry: Secondary | ICD-10-CM | POA: Diagnosis present

## 2021-07-26 DIAGNOSIS — D509 Iron deficiency anemia, unspecified: Secondary | ICD-10-CM

## 2021-07-26 LAB — CBC WITH DIFFERENTIAL/PLATELET
Abs Immature Granulocytes: 0.01 10*3/uL (ref 0.00–0.07)
Basophils Absolute: 0 10*3/uL (ref 0.0–0.1)
Basophils Relative: 1 %
Eosinophils Absolute: 0.2 10*3/uL (ref 0.0–0.5)
Eosinophils Relative: 7 %
HCT: 25.3 % — ABNORMAL LOW (ref 36.0–46.0)
Hemoglobin: 6.9 g/dL — CL (ref 12.0–15.0)
Immature Granulocytes: 0 %
Lymphocytes Relative: 39 %
Lymphs Abs: 1.4 10*3/uL (ref 0.7–4.0)
MCH: 18.8 pg — ABNORMAL LOW (ref 26.0–34.0)
MCHC: 27.3 g/dL — ABNORMAL LOW (ref 30.0–36.0)
MCV: 68.8 fL — ABNORMAL LOW (ref 80.0–100.0)
Monocytes Absolute: 0.4 10*3/uL (ref 0.1–1.0)
Monocytes Relative: 11 %
Neutro Abs: 1.5 10*3/uL — ABNORMAL LOW (ref 1.7–7.7)
Neutrophils Relative %: 42 %
Platelets: 196 10*3/uL (ref 150–400)
RBC: 3.68 MIL/uL — ABNORMAL LOW (ref 3.87–5.11)
RDW: 18.6 % — ABNORMAL HIGH (ref 11.5–15.5)
WBC: 3.6 10*3/uL — ABNORMAL LOW (ref 4.0–10.5)
nRBC: 0 % (ref 0.0–0.2)

## 2021-07-26 LAB — COMPREHENSIVE METABOLIC PANEL
ALT: 28 U/L (ref 0–44)
AST: 33 U/L (ref 15–41)
Albumin: 4 g/dL (ref 3.5–5.0)
Alkaline Phosphatase: 56 U/L (ref 38–126)
Anion gap: 5 (ref 5–15)
BUN: 15 mg/dL (ref 8–23)
CO2: 24 mmol/L (ref 22–32)
Calcium: 8.4 mg/dL — ABNORMAL LOW (ref 8.9–10.3)
Chloride: 107 mmol/L (ref 98–111)
Creatinine, Ser: 0.66 mg/dL (ref 0.44–1.00)
GFR, Estimated: >60 mL/min (ref >60)
Glucose, Bld: 107 mg/dL — ABNORMAL HIGH (ref 70–99)
Potassium: 4.1 mmol/L (ref 3.5–5.1)
Sodium: 136 mmol/L (ref 135–145)
Total Bilirubin: 1 mg/dL (ref 0.3–1.2)
Total Protein: 7.3 g/dL (ref 6.5–8.1)

## 2021-07-26 LAB — IRON AND TIBC
Iron: 25 ug/dL — ABNORMAL LOW (ref 28–170)
Saturation Ratios: 5 % — ABNORMAL LOW (ref 10.4–31.8)
TIBC: 524 ug/dL — ABNORMAL HIGH (ref 250–450)
UIBC: 499 ug/dL

## 2021-07-26 LAB — FERRITIN: Ferritin: 3 ng/mL — ABNORMAL LOW (ref 11–307)

## 2021-07-26 LAB — TYPE AND SCREEN
ABO/RH(D): O POS
Antibody Screen: NEGATIVE

## 2021-07-26 NOTE — ED Triage Notes (Signed)
Patient states she was called today and was referred to the ED for a low blood count. Patient not sure what was low. Patient denies any obvious bleeding.

## 2021-07-26 NOTE — ED Triage Notes (Signed)
Critical hemoglobin 6.9 - reported to Marva Panda, PA

## 2021-07-26 NOTE — ED Provider Notes (Signed)
[facility] DEPT Provider Note   CSN: [id number] Arrival date & time: 07/26/21  1234     History Chief Complaint  Patient presents with   Abnormal Lab    Leah Mathews is a 70 y.o. female.  HPI She presents for evaluation of known anemia, stated to be 7 1 based on labs drawn yesterday at her PCPs office.  This was discovered on annual labs during the visit yesterday.  She reports feeling somewhat weak and occasionally winded with walking distances over the last several weeks.  She has not seen any rectal bleeding.  She saw very small amount of blood that she thought was from her vagina when she wiped after toileting several days ago.  This did not recur.  Her PCP gave her stool collection kit for occult blood yesterday and she plans uncinated.  Her last colonoscopy was about 12 years ago.  It was normal at that time.  She does not have any other systemic symptoms.    Past Medical History:  Diagnosis Date   Asthma    Depression    Epilepsy [facility])     Patient Active Problem List   Diagnosis Date Noted   Vaccine or biological substance causing adverse effect in therapeutic use, subsequent encounter 11/25/2019   Need for prophylactic vaccination against Streptococcus pneumoniae (pneumococcus) 11/25/2019    Past Surgical History:  Procedure Laterality Date   cataract surgery     COLONOSCOPY     ECTOPIC PREGNANCY SURGERY     wisdom teeth removal       OB History   No obstetric history on file.     Family History  Problem Relation Age of Onset   Congestive Heart Failure Mother    Heart disease Father     Social History   Tobacco Use   Smoking status: Never   Smokeless tobacco: Never  Vaping Use   Vaping Use: Never used  Substance Use Topics   Alcohol use: Yes    Comment: occasionally   Drug use: No    Home Medications Prior to Admission medications   Medication Sig Start Date End Date Taking? Authorizing Provider  albuterol  (VENTOLIN HFA) 108 (90 Base) MCG/ACT inhaler Inhale 2 puffs into the lungs every 6 (six) hours as needed for wheezing or shortness of breath.    [provider]  cholecalciferol (VITAMIN D) 1000 UNITS tablet Take 1,000 Units by mouth daily.    [provider]  Magnesium 200 MG TABS Take 1 tablet by mouth daily.    [provider]  VITAMIN D PO Take 1 capsule by mouth daily.    [provider]    Allergies    Ciprofloxacin, Sulfa antibiotics, Tetanus toxoid, Amitriptyline, Ciprofloxacin, Elemental sulfur, and Tetanus toxoids  Review of Systems   Review of Systems  All other systems reviewed and are negative.  Physical Exam Updated Vital Signs BP 126/70    Pulse 81    Temp 98.2 F (36.8 C) (Oral)    Resp 18    Ht 5' 4.75" (1.645 m)    Wt 66.7 kg    SpO2 100%    BMI 24.65 kg/m   Physical Exam Vitals and nursing note reviewed.  Constitutional:      Appearance: She is well-developed. She is not ill-appearing.  HENT:     Head: Normocephalic and atraumatic.     Right Ear: External ear normal.     Left Ear: External ear normal.  Nose: No congestion.     Mouth/Throat:     Pharynx: No posterior oropharyngeal erythema.  Eyes:     Conjunctiva/sclera: Conjunctivae normal.     Pupils: Pupils are equal, round, and reactive to light.  Neck:     Trachea: Phonation normal.  Cardiovascular:     Rate and Rhythm: Normal rate and regular rhythm.     Heart sounds: Normal heart sounds.  Pulmonary:     Effort: Pulmonary effort is normal.     Breath sounds: Normal breath sounds.  Abdominal:     General: There is no distension.     Palpations: Abdomen is soft.     Tenderness: There is no abdominal tenderness.  Musculoskeletal:        General: Normal range of motion.     Cervical back: Normal range of motion and neck supple.  Skin:    General: Skin is warm and dry.  Neurological:     Mental Status: She is alert and oriented to person, place, and time.      Cranial Nerves: No cranial nerve deficit.     Sensory: No sensory deficit.     Motor: No abnormal muscle tone.     Coordination: Coordination normal.  Psychiatric:        Mood and Affect: Mood normal.        Behavior: Behavior normal.        Thought Content: Thought content normal.        Judgment: Judgment normal.    ED Results / Procedures / Treatments   Labs (all labs ordered are listed, but only abnormal results are displayed) Labs Reviewed  COMPREHENSIVE METABOLIC PANEL - Abnormal; Notable for the following components:      Result Value   Glucose, Bld 107 (*)    Calcium 8.4 (*)    All other components within normal limits  CBC WITH DIFFERENTIAL/PLATELET - Abnormal; Notable for the following components:   WBC 3.6 (*)    RBC 3.68 (*)    Hemoglobin 6.9 (*)    HCT 25.3 (*)    MCV 68.8 (*)    MCH 18.8 (*)    MCHC 27.3 (*)    RDW 18.6 (*)    Neutro Abs 1.5 (*)    All other components within normal limits  IRON AND TIBC - Abnormal; Notable for the following components:   Iron 25 (*)    TIBC 524 (*)    Saturation Ratios 5 (*)    All other components within normal limits  FERRITIN - Abnormal; Notable for the following components:   Ferritin 3 (*)    All other components within normal limits  TYPE AND SCREEN    EKG None  Radiology No results found.  Procedures Procedures   Medications Ordered in ED Medications - No data to display  ED Course  I have reviewed the triage vital signs and the nursing notes.  Pertinent labs & imaging results that were available during my care of the patient were reviewed by me and considered in my medical decision making (see chart for details).  Clinical Course as of 07/27/21 1025  Sat Jul 27, 2021  1023 Ferritin(!): 3 Low [EW]  1024 Iron(!): 25 Low [EW]  1024 TIBC(!): 524 High [EW]  1025 Saturation Ratios(!): 5 low [EW]    Clinical Course User Index [EW] Daleen Bo, MD   MDM Rules/Calculators/A&P  Patient Vitals for the past 24 hrs:  BP Temp Temp src Pulse Resp SpO2 Height Weight  07/26/21 1700 126/70 -- -- 81 18 100 % -- --  07/26/21 1630 (!) 130/98 -- -- 73 17 98 % -- --  07/26/21 1600 (!) 154/126 -- -- 74 17 100 % -- --  07/26/21 1530 (!) 156/91 -- -- 83 19 100 % -- --  07/26/21 1414 133/81 -- -- 83 18 99 % -- --  07/26/21 1242 -- -- -- -- -- -- 5' 4.75" (1.645 m) 66.7 kg  07/26/21 1240 139/86 98.2 F (36.8 C) Oral 98 16 97 % -- --    10:25 AM Reevaluation with update and discussion with no change in clinical status. After initial assessment and treatment, an updated evaluation reveals she is comfortable and ready to go home. Illness risk, worsening blood count leading to increasing systemic symptoms, discussed. Daleen Bo    Medical Decision Making: Summary: She is here after routine testing showed she is anemic, yesterday.  She does not have any severe symptoms associated with anemia.  She is mildly lethargic and gets tired with walking.  No prior history of anemia or known significant blood loss  Critical Interventions-confirmation labs CBC and metabolic panel; to evaluate  Chief Complaint  Patient presents with   Abnormal Lab    and assess for illness characterized as Previously Undiagnosed, Uncertain Prognosis, and Systemic Symptoms   After These Interventions, the Patient was reevaluated and was found with anemia, confirmed labs from yesterday.  Minimal symptoms.  Patient was offered transfusion but declined.  We discussed the risks and benefits of this decision.  We discussed potential to improve hemoglobin with iron treatment and she is agreeable to take that medicine at home.  No evidence for hemodynamic instability, acute cardiac or pulmonary disorders.  Doubt loss of blood from GI or GU systems.  This patient is presenting for evaluation of anemia, which does require a range of treatment options, and is a complaint that involves a moderate risk of morbidity  and mortality. The differential diagnoses include blood loss, pulm and infectious disorder, hematologic disorder.  She does not have any comorbid conditions I decided to review external data, and in summary elderly female presenting with anemia, but does not have history of same.  She has mild symptoms.  I did not require additional historical information from anyone, as the patient is lucid.  Clinical Laboratory Tests Ordered, included CBC, Metabolic panel, and blood type, iron and TIBC levels, ferritin level . Review indicates initial labs remarkable for trivial elevation of glucose and low calcium.  White count slightly low.  Hemoglobin and MCV low accounting for anemia. Emergent testing abnormality management required for stabilization-no   I did not require any additional medicine Test.  Pharmaceutical Risk Management outpatient medicine added OTC  Treatment Complication Risk Requirement low level     CRITICAL CARE-no Performed by: Daleen Bo  Nursing Notes Reviewed/ Care Coordinated Applicable Imaging Reviewed Interpretation of Laboratory Data incorporated into ED treatment  The patient appears reasonably screened and/or stabilized for discharge and I doubt any other medical condition or other [facility] requiring further screening, evaluation, or treatment in the ED at this time prior to discharge.  Plan: Home Medications-start over-the-counter iron, 325 mg, twice daily and continue for 1 month.  Take stool softener daily to prevent constipation continue routine medicines; Home Treatments-gradually advance diet and activity; return here if the recommended treatment, does not improve the symptoms; Recommended follow up-PCP follow-up  2 to 3 weeks and as needed        Final Clinical Impression(s) / ED Diagnoses Final diagnoses:  Iron deficiency anemia, unspecified iron deficiency anemia type    Rx / DC Orders ED Discharge Orders     None        Daleen Bo,  MD 07/27/21 1025

## 2021-07-26 NOTE — ED Provider Notes (Signed)
Emergency Medicine Provider Triage Evaluation Note  Leah Mathews , a 70 y.o. female  was evaluated in triage.  Pt complains of abnormal hemoglobin.  Patient states that she went to primary care yesterday Leah Mathews).  She states that she was called this morning and told to come back in for repeat lab.  She states that her hemoglobin was lower and they recommended her to be evaluated.  On my review of chart it appears that the last hemoglobin was 7.1.  The patient does endorse some decreased stamina over the past year.  She also endorses intermittent lightheadedness and dizziness as well as intermittent shortness of breath.  She denies any chest pain or palpitations.  She denies any melena, hematochezia, hemoptysis or abdominal pain.  Review of Systems  Positive: See above Negative:   Physical Exam  BP 139/86 (BP Location: Left Arm)    Pulse 98    Temp 98.2 F (36.8 C) (Oral)    Resp 16    Ht 5' 4.75" (1.645 m)    Wt 66.7 kg    SpO2 97%    BMI 24.65 kg/m  Gen:   Awake, no distress   Resp:  Normal effort  MSK:   Moves extremities without difficulty  Other:  Abdomen is rounded, soft and nontender to palpation.  Lungs clear to auscultation bilaterally.  S1/S2 without murmur.  Medical Decision Making  Medically screening exam initiated at 12:51 PM.  Appropriate orders placed.  Lateia Fraser was informed that the remainder of the evaluation will be completed by another provider, this initial triage assessment does not replace that evaluation, and the importance of remaining in the ED until their evaluation is complete.     Cristopher Peru, PA-C 07/26/21 1253    Wynetta Fines, MD 07/26/21 [phone]

## 2021-07-26 NOTE — Discharge Instructions (Signed)
Your anemia is likely secondary to iron deficiency.  We have added a couple of labs onto help confirm that.  When you see your doctor in 2 weeks talk to her about these results.  You have chosen to not receive a blood transfusion today.  Your blood count can improve by taking iron regularly.  I suggest you take iron 325 mg, twice a day, for 1 month.  This should improve your blood count to greater than 10.  Iron can constipate you so take Colace, daily to prevent constipation.  Rest, drink fluids and gradually increase your activity to prevent further symptoms.  Try to eat a well-balanced diet.  Return here if needed for problems.

## 2021-08-02 DIAGNOSIS — Z1211 Encounter for screening for malignant neoplasm of colon: Secondary | ICD-10-CM | POA: Diagnosis not present

## 2021-08-06 DIAGNOSIS — D649 Anemia, unspecified: Secondary | ICD-10-CM | POA: Diagnosis not present

## 2021-08-13 DIAGNOSIS — D500 Iron deficiency anemia secondary to blood loss (chronic): Secondary | ICD-10-CM | POA: Diagnosis not present

## 2021-08-13 DIAGNOSIS — R195 Other fecal abnormalities: Secondary | ICD-10-CM | POA: Diagnosis not present

## 2021-08-28 DIAGNOSIS — D500 Iron deficiency anemia secondary to blood loss (chronic): Secondary | ICD-10-CM | POA: Diagnosis not present

## 2021-09-04 DIAGNOSIS — D500 Iron deficiency anemia secondary to blood loss (chronic): Secondary | ICD-10-CM | POA: Diagnosis not present

## 2021-09-10 ENCOUNTER — Encounter: Payer: Self-pay | Admitting: *Deleted

## 2021-09-11 DIAGNOSIS — D500 Iron deficiency anemia secondary to blood loss (chronic): Secondary | ICD-10-CM | POA: Diagnosis not present

## 2021-09-13 ENCOUNTER — Ambulatory Visit: Payer: Medicare PPO | Admitting: Diagnostic Neuroimaging

## 2021-09-13 ENCOUNTER — Encounter: Payer: Self-pay | Admitting: Diagnostic Neuroimaging

## 2021-09-13 VITALS — BP 139/86 | HR 88 | Ht 64.75 in | Wt 148.0 lb

## 2021-09-13 DIAGNOSIS — D509 Iron deficiency anemia, unspecified: Secondary | ICD-10-CM | POA: Diagnosis not present

## 2021-09-13 DIAGNOSIS — R200 Anesthesia of skin: Secondary | ICD-10-CM | POA: Diagnosis not present

## 2021-09-13 DIAGNOSIS — E538 Deficiency of other specified B group vitamins: Secondary | ICD-10-CM

## 2021-09-13 NOTE — Patient Instructions (Signed)
NUMBNESS / PAIN IN TOES (since at least 2014; could be related to B12 deficiency, iron deficiency; lumbar radiculopathies)  - continue B12 and iron replacements  - consider MRI lumbar spine  - considered EMG/NCS nerve testing (ok to hold off as symptoms and neuro exam are unremarkable at this time, and likely to be low yield given chronicity of symptoms)  - continue physical exercises / therapy exercises; consider massage therapy, yoga, water therapy/swimming

## 2021-09-13 NOTE — Progress Notes (Signed)
[facility]  PATIENT: Leah Mathews DOB: March 18, 1951  REFERRING CLINICIAN: Shon Hale, *  HISTORY FROM: patient  REASON FOR VISIT: new consult    HISTORICAL  CHIEF COMPLAINT:  Chief Complaint  Patient presents with   New Patient (Initial Visit)    Rm 7. Alone. NX Pen LV 2015/[facility] @ Sallyanne Kuster MD/possible peripheral neuropathy    HISTORY OF PRESENT ILLNESS:   UPDATE (09/13/21, VRP): Since last visit, here for eval of numbness and pain in toes. Has been chroninc since 2014, but more lately. Now found to have B12 and iron deficiency in last 3-6 months, now on replacement, and doing better. Some low back pain issues. Sxs worse in AM when waking up and improve through the day.   UPDATE 11/28/14: Since last visit, doing much better with hip/leg/back issues, esp with PT exercises and eval. Had 1 episode of right shoulder pain/spasm, but this resolved after 2 days of naproxen. Overall doing well.  UPDATE 07/26/14: Since last visit, continues to have intermittent pain in bilateral buttocks, radiating to the legs (back of thighs into calves). Symptoms worse with sitting for long time. Some numbness at night time in hands. Has been less active lately. No weakness.   NEW HPI (05/10/14): Patient known to me from prior evaluations. However patient presents for new problem consisting of bilateral leg pain, restless legs, numbness. Approximately one year ago patient with onset of cramps, pain, uneasy sensation in the legs. More often she has right hip, right leg, right calf radiating pain, as compared to the left side. Over the past 1 month patient is noted intermittent jerking, uneasy restless sensation in the legs, especially she lays down to sleep at night. Sometimes this wakes her up from sleep. Patient has been taking magnesium supplements last 2 weeks which has significantly improved the symptoms. However she still has pain in the  right leg, radiating downward. Patient was evaluated with iron studies, thyroid function testing, CBC which were unremarkable. Patient was prescribed Mirapex, but she has not tried this yet as the magnesium supplement seems to help her symptoms.  PRIOR HPI (09/26/13): 71 year old female here for evaluation of intermittent electrical zaps. Patient describes brief electrical charge sensation in the back of her head, mainly on the right side but sometimes on the left. Episodes last just for one second at a time. She has these events sporadically over the last 8-10 years. Several months ago these increased in frequency with more pain. These were occurring every minute at a time. Symptoms have decreased recently. Patient has tried over-the-counter medication without relief. She was also prescribed Tegretol for possible trigeminal neuralgia which did not help. I previously saw patient in 2012 for a different problem, vertigo ringing in the ears and seizure disorder. These are improved. No further seizures. No further vertigo.  PRIOR HPI (12/16/10): 71 year old right-handed female with history of seizure disorder, anxiety, hearing impairment, shingles, here for evaluation of vertigo, tinnitus and evaluation for seizure disorder. Age 19 years old - patient had a combination of grand mal seizures, drop attacks, myoclonic jerks, and was treated with Dilantin x 7 years.  She had no further sz once starting Dilantin.  Eventually she was taken off of Dilantin, and she has had no further seizures since then. April 2011 - developed right-sided "TMJ" pain and problems with cherwing; was seen by a dentist specialists and treated conservatively with heat packs.  Eventually this symptom subsided. Around the same time she developed intermittent positional vertigo (  room spinning) with turning her head to the right.  This was also self-limited. Mar 2012 - developed well ringing sensation in her ears like a "freight train", and one  morning woke up with a "burst blood vessel in her left eye". Patient reports history of hearing impairment since age 69 years old, and wearing hearing aids for the past 6 years.   REVIEW OF SYSTEMS: Full 14 system review of systems performed and notable only for nothing   ALLERGIES: Allergies  Allergen Reactions   Ciprofloxacin Rash   Sulfa Antibiotics Rash   Tetanus Toxoid Rash   Amitriptyline Other (See Comments)    Depression   Tizanidine Hcl     Other reaction(s): hallucinations   Ciprofloxacin Rash   Elemental Sulfur Rash   Tetanus Toxoids Rash    HOME MEDICATIONS: Outpatient Medications Prior to Visit  Medication Sig Dispense Refill   albuterol (VENTOLIN HFA) 108 (90 Base) MCG/ACT inhaler Inhale 2 puffs into the lungs every 6 (six) hours as needed for wheezing or shortness of breath.     Cyanocobalamin (VITAMIN B12) 1000 MCG TBCR Take 1 tablet by mouth daily.     Magnesium 200 MG TABS Take 1 tablet by mouth daily.     Omega-3 Fatty Acids (FISH OIL PO) Take 1 capsule by mouth daily. 1300 mg     VITAMIN D PO Take 2,000 Units by mouth daily.     VITAMIN D PO Take 1 capsule by mouth daily.     No facility-administered medications prior to visit.    PAST MEDICAL HISTORY: Past Medical History:  Diagnosis Date   Asthma    Chronic back pain    Depression    Epilepsy (HCC)    childhood   Neuralgia    Peripheral neuropathic pain     PAST SURGICAL HISTORY: Past Surgical History:  Procedure Laterality Date   cataract surgery Left 2017   COLONOSCOPY     ECTOPIC PREGNANCY SURGERY  1982   wisdom teeth removal      FAMILY HISTORY: Family History  Problem Relation Age of Onset   Congestive Heart Failure Mother    Heart disease Father    COPD Sister     SOCIAL HISTORY:  Social History   Socioeconomic History   Marital status: Divorced    Spouse name: Not on file   Number of children: 2   Years of education: college   Highest education level: Not on file   Occupational History   Occupation: [employer]  Tobacco Use   Smoking status: Never   Smokeless tobacco: Never  Vaping Use   Vaping Use: Never used  Substance and Sexual Activity   Alcohol use: Yes    Comment: occasionally   Drug use: No   Sexual activity: Not on file  Other Topics Concern   Not on file  Social History Narrative    Patient lives at home with son.   Caffeine Use: 2 cups daily   Social Determinants of Health   Financial Resource Strain: Not on file  Food Insecurity: Not on file  Transportation Needs: Not on file  Physical Activity: Not on file  Stress: Not on file  Social Connections: Not on file  Intimate Partner Violence: Not on file     PHYSICAL EXAM  GENERAL EXAM/CONSTITUTIONAL: Vitals:  Vitals:   09/13/21 0851  BP: 139/86  Pulse: 88  Weight: 148 lb (67.1 kg)  Height: 5' 4.75" (1.645 m)   Body mass index is 24.82 kg/m. Wt  Readings from Last 3 Encounters:  09/13/21 148 lb (67.1 kg)  07/26/21 147 lb (66.7 kg)  08/11/19 146 lb 12.8 oz (66.6 kg)   Patient is in no distress; well developed, nourished and groomed; neck is supple  CARDIOVASCULAR: Examination of carotid arteries is normal; no carotid bruits Regular rate and rhythm, no murmurs Examination of peripheral vascular system by observation and palpation is normal  EYES: Ophthalmoscopic exam of optic discs and posterior segments is normal; no papilledema or hemorrhages No results found.  MUSCULOSKELETAL: Gait, strength, tone, movements noted in Neurologic exam below  NEUROLOGIC: MENTAL STATUS:  No flowsheet data found. awake, alert, oriented to person, place and time recent and remote memory intact normal attention and concentration language fluent, comprehension intact, naming intact fund of knowledge appropriate  CRANIAL NERVE:  2nd - no papilledema on fundoscopic exam 2nd, 3rd, 4th, 6th - pupils equal and reactive to light, visual fields full to confrontation, extraocular  muscles intact, no nystagmus 5th - facial sensation symmetric 7th - facial strength symmetric 8th - hearing intact 9th - palate elevates symmetrically, uvula midline 11th - shoulder shrug symmetric 12th - tongue protrusion midline  MOTOR:  normal bulk and tone, full strength in the BUE, BLE  SENSORY:  normal and symmetric to light touch, temperature, vibration  COORDINATION:  finger-nose-finger, fine finger movements normal  REFLEXES:  deep tendon reflexes trace and symmetric  GAIT/STATION:  narrow based gait     DIAGNOSTIC DATA (LABS, IMAGING, TESTING) - I reviewed patient records, labs, notes, testing and imaging myself where available.  Lab Results  Component Value Date   WBC 3.6 (L) 07/26/2021      Component Value Date/Time   NA 136 07/26/2021 1304   No results found for: CHOL No results found for: HGBA1C No results found for: VITAMINB12 No results found for: TSH  01/16/11 MRI brain - few non-specific foci of gliosis  01/16/11 MRA head - normal   ASSESSMENT AND PLAN  71 y.o. year old female here with:  Dx:   1. Numbness of toes   2. B12 deficiency   3. Iron deficiency anemia, unspecified iron deficiency anemia type     PLAN:  NUMBNESS / PAIN IN TOES (since at least 2014; could be related to B12 deficiency, iron deficiency; lumbar radiculopathies) - continue B12 and iron replacements - considered MRI lumbar spine (ok to hold of as neuro exam unremarkable and patient not interested in surgery at this time) - considered EMG/NCS (ok to hold off as symptoms and neuro exam are unremarkable at this time, and likely to be low yield given chronicity of symptoms) - continue physical exercises / therapy exercises; consider massage therapy, yoga, water therapy/swimming  Return for return to PCP, pending if symptoms worsen or fail to improve.   Suanne Marker, MD 09/13/2021, 9:29 AM Certified in Neurology, Neurophysiology and Neuroimaging  [facility] [address], [address] [place], [place] [address] [phone]

## 2021-09-20 DIAGNOSIS — D124 Benign neoplasm of descending colon: Secondary | ICD-10-CM | POA: Diagnosis not present

## 2021-09-20 DIAGNOSIS — K2950 Unspecified chronic gastritis without bleeding: Secondary | ICD-10-CM | POA: Diagnosis not present

## 2021-09-20 DIAGNOSIS — K644 Residual hemorrhoidal skin tags: Secondary | ICD-10-CM | POA: Diagnosis not present

## 2021-09-20 DIAGNOSIS — K31A11 Gastric intestinal metaplasia without dysplasia, involving the antrum: Secondary | ICD-10-CM | POA: Diagnosis not present

## 2021-09-20 DIAGNOSIS — D500 Iron deficiency anemia secondary to blood loss (chronic): Secondary | ICD-10-CM | POA: Diagnosis not present

## 2021-09-20 DIAGNOSIS — K31A0 Gastric intestinal metaplasia, unspecified: Secondary | ICD-10-CM | POA: Diagnosis not present

## 2021-09-20 DIAGNOSIS — K635 Polyp of colon: Secondary | ICD-10-CM | POA: Diagnosis not present

## 2021-09-20 DIAGNOSIS — R195 Other fecal abnormalities: Secondary | ICD-10-CM | POA: Diagnosis not present

## 2021-09-20 DIAGNOSIS — K5730 Diverticulosis of large intestine without perforation or abscess without bleeding: Secondary | ICD-10-CM | POA: Diagnosis not present

## 2021-09-20 DIAGNOSIS — K648 Other hemorrhoids: Secondary | ICD-10-CM | POA: Diagnosis not present

## 2021-10-10 DIAGNOSIS — D500 Iron deficiency anemia secondary to blood loss (chronic): Secondary | ICD-10-CM | POA: Diagnosis not present

## 2021-10-23 ENCOUNTER — Ambulatory Visit: Payer: Medicare PPO | Admitting: Diagnostic Neuroimaging

## 2021-10-30 DIAGNOSIS — Z961 Presence of intraocular lens: Secondary | ICD-10-CM | POA: Diagnosis not present

## 2021-10-30 DIAGNOSIS — H5213 Myopia, bilateral: Secondary | ICD-10-CM | POA: Diagnosis not present

## 2021-11-06 DIAGNOSIS — L821 Other seborrheic keratosis: Secondary | ICD-10-CM | POA: Diagnosis not present

## 2021-11-06 DIAGNOSIS — D2371 Other benign neoplasm of skin of right lower limb, including hip: Secondary | ICD-10-CM | POA: Diagnosis not present

## 2021-11-06 DIAGNOSIS — L578 Other skin changes due to chronic exposure to nonionizing radiation: Secondary | ICD-10-CM | POA: Diagnosis not present

## 2021-11-06 DIAGNOSIS — D225 Melanocytic nevi of trunk: Secondary | ICD-10-CM | POA: Diagnosis not present

## 2021-11-06 DIAGNOSIS — Z85828 Personal history of other malignant neoplasm of skin: Secondary | ICD-10-CM | POA: Diagnosis not present

## 2021-12-06 DIAGNOSIS — D649 Anemia, unspecified: Secondary | ICD-10-CM | POA: Diagnosis not present

## 2022-01-08 DIAGNOSIS — M7061 Trochanteric bursitis, right hip: Secondary | ICD-10-CM | POA: Diagnosis not present

## 2022-01-08 DIAGNOSIS — M25551 Pain in right hip: Secondary | ICD-10-CM | POA: Diagnosis not present

## 2022-02-04 DIAGNOSIS — M25551 Pain in right hip: Secondary | ICD-10-CM | POA: Diagnosis not present

## 2022-02-04 DIAGNOSIS — M545 Low back pain, unspecified: Secondary | ICD-10-CM | POA: Diagnosis not present

## 2022-02-05 DIAGNOSIS — F419 Anxiety disorder, unspecified: Secondary | ICD-10-CM | POA: Diagnosis not present

## 2022-02-05 DIAGNOSIS — D649 Anemia, unspecified: Secondary | ICD-10-CM | POA: Diagnosis not present

## 2022-02-05 DIAGNOSIS — E538 Deficiency of other specified B group vitamins: Secondary | ICD-10-CM | POA: Diagnosis not present

## 2022-02-24 DIAGNOSIS — J45909 Unspecified asthma, uncomplicated: Secondary | ICD-10-CM | POA: Diagnosis not present

## 2022-02-24 DIAGNOSIS — Z811 Family history of alcohol abuse and dependence: Secondary | ICD-10-CM | POA: Diagnosis not present

## 2022-02-24 DIAGNOSIS — M1990 Unspecified osteoarthritis, unspecified site: Secondary | ICD-10-CM | POA: Diagnosis not present

## 2022-02-24 DIAGNOSIS — Z823 Family history of stroke: Secondary | ICD-10-CM | POA: Diagnosis not present

## 2022-02-24 DIAGNOSIS — F419 Anxiety disorder, unspecified: Secondary | ICD-10-CM | POA: Diagnosis not present

## 2022-02-24 DIAGNOSIS — R030 Elevated blood-pressure reading, without diagnosis of hypertension: Secondary | ICD-10-CM | POA: Diagnosis not present

## 2022-02-24 DIAGNOSIS — Z8249 Family history of ischemic heart disease and other diseases of the circulatory system: Secondary | ICD-10-CM | POA: Diagnosis not present

## 2022-02-24 DIAGNOSIS — F3341 Major depressive disorder, recurrent, in partial remission: Secondary | ICD-10-CM | POA: Diagnosis not present

## 2022-02-24 DIAGNOSIS — M792 Neuralgia and neuritis, unspecified: Secondary | ICD-10-CM | POA: Diagnosis not present

## 2022-02-24 DIAGNOSIS — M25551 Pain in right hip: Secondary | ICD-10-CM | POA: Diagnosis not present

## 2022-02-26 DIAGNOSIS — M25551 Pain in right hip: Secondary | ICD-10-CM | POA: Diagnosis not present

## 2022-03-05 DIAGNOSIS — M25551 Pain in right hip: Secondary | ICD-10-CM | POA: Diagnosis not present

## 2022-03-07 DIAGNOSIS — M25551 Pain in right hip: Secondary | ICD-10-CM | POA: Diagnosis not present

## 2022-03-12 DIAGNOSIS — M25551 Pain in right hip: Secondary | ICD-10-CM | POA: Diagnosis not present

## 2022-03-17 DIAGNOSIS — M25551 Pain in right hip: Secondary | ICD-10-CM | POA: Diagnosis not present

## 2022-03-19 DIAGNOSIS — M25551 Pain in right hip: Secondary | ICD-10-CM | POA: Diagnosis not present

## 2022-03-24 DIAGNOSIS — M25551 Pain in right hip: Secondary | ICD-10-CM | POA: Diagnosis not present

## 2022-03-26 DIAGNOSIS — M25551 Pain in right hip: Secondary | ICD-10-CM | POA: Diagnosis not present

## 2022-04-03 DIAGNOSIS — R000 Tachycardia, unspecified: Secondary | ICD-10-CM | POA: Diagnosis not present

## 2022-04-03 DIAGNOSIS — U071 COVID-19: Secondary | ICD-10-CM | POA: Diagnosis not present

## 2022-04-11 DIAGNOSIS — M25551 Pain in right hip: Secondary | ICD-10-CM | POA: Diagnosis not present

## 2022-04-24 DIAGNOSIS — Z23 Encounter for immunization: Secondary | ICD-10-CM | POA: Diagnosis not present

## 2022-06-26 DIAGNOSIS — M25551 Pain in right hip: Secondary | ICD-10-CM | POA: Diagnosis not present

## 2022-06-26 DIAGNOSIS — M545 Low back pain, unspecified: Secondary | ICD-10-CM | POA: Diagnosis not present

## 2022-06-30 DIAGNOSIS — M25551 Pain in right hip: Secondary | ICD-10-CM | POA: Diagnosis not present

## 2022-06-30 DIAGNOSIS — M545 Low back pain, unspecified: Secondary | ICD-10-CM | POA: Diagnosis not present

## 2022-07-08 DIAGNOSIS — M5416 Radiculopathy, lumbar region: Secondary | ICD-10-CM | POA: Diagnosis not present

## 2022-07-30 DIAGNOSIS — E7800 Pure hypercholesterolemia, unspecified: Secondary | ICD-10-CM | POA: Diagnosis not present

## 2022-07-30 DIAGNOSIS — Z0000 Encounter for general adult medical examination without abnormal findings: Secondary | ICD-10-CM | POA: Diagnosis not present

## 2022-07-30 DIAGNOSIS — Z79899 Other long term (current) drug therapy: Secondary | ICD-10-CM | POA: Diagnosis not present

## 2022-07-30 DIAGNOSIS — E538 Deficiency of other specified B group vitamins: Secondary | ICD-10-CM | POA: Diagnosis not present

## 2022-07-30 DIAGNOSIS — M792 Neuralgia and neuritis, unspecified: Secondary | ICD-10-CM | POA: Diagnosis not present

## 2022-07-30 DIAGNOSIS — M8589 Other specified disorders of bone density and structure, multiple sites: Secondary | ICD-10-CM | POA: Diagnosis not present

## 2022-07-30 DIAGNOSIS — F419 Anxiety disorder, unspecified: Secondary | ICD-10-CM | POA: Diagnosis not present

## 2022-07-30 DIAGNOSIS — Z887 Allergy status to serum and vaccine status: Secondary | ICD-10-CM | POA: Diagnosis not present

## 2022-07-31 ENCOUNTER — Other Ambulatory Visit: Payer: Self-pay | Admitting: Family Medicine

## 2022-07-31 DIAGNOSIS — E2839 Other primary ovarian failure: Secondary | ICD-10-CM

## 2022-07-31 DIAGNOSIS — Z1231 Encounter for screening mammogram for malignant neoplasm of breast: Secondary | ICD-10-CM

## 2022-08-13 DIAGNOSIS — M25552 Pain in left hip: Secondary | ICD-10-CM | POA: Diagnosis not present

## 2022-08-13 DIAGNOSIS — M25551 Pain in right hip: Secondary | ICD-10-CM | POA: Diagnosis not present

## 2022-09-13 DIAGNOSIS — M25552 Pain in left hip: Secondary | ICD-10-CM | POA: Diagnosis not present

## 2022-09-13 DIAGNOSIS — M25551 Pain in right hip: Secondary | ICD-10-CM | POA: Diagnosis not present

## 2022-09-18 ENCOUNTER — Ambulatory Visit
Admission: RE | Admit: 2022-09-18 | Discharge: 2022-09-18 | Disposition: A | Discharge status: Home / Self Care | Payer: Medicare PPO | Source: Ambulatory Visit | Attending: Family Medicine | Admitting: Family Medicine

## 2022-09-18 DIAGNOSIS — Z1231 Encounter for screening mammogram for malignant neoplasm of breast: Secondary | ICD-10-CM | POA: Diagnosis not present

## 2022-10-03 ENCOUNTER — Other Ambulatory Visit: Payer: Self-pay | Admitting: Gastroenterology

## 2022-10-03 DIAGNOSIS — K31819 Angiodysplasia of stomach and duodenum without bleeding: Secondary | ICD-10-CM | POA: Diagnosis not present

## 2022-10-03 DIAGNOSIS — K31A0 Gastric intestinal metaplasia, unspecified: Secondary | ICD-10-CM | POA: Diagnosis not present

## 2022-10-03 DIAGNOSIS — Z86010 Personal history of colonic polyps: Secondary | ICD-10-CM | POA: Diagnosis not present

## 2022-10-30 ENCOUNTER — Ambulatory Visit
Admission: RE | Admit: 2022-10-30 | Discharge: 2022-10-30 | Disposition: A | Discharge status: Home / Self Care | Payer: Medicare PPO | Source: Ambulatory Visit | Attending: Gastroenterology | Admitting: Gastroenterology

## 2022-10-30 DIAGNOSIS — K31819 Angiodysplasia of stomach and duodenum without bleeding: Secondary | ICD-10-CM

## 2022-11-05 DIAGNOSIS — K2930 Chronic superficial gastritis without bleeding: Secondary | ICD-10-CM | POA: Diagnosis not present

## 2022-11-05 DIAGNOSIS — K31A19 Gastric intestinal metaplasia without dysplasia, unspecified site: Secondary | ICD-10-CM | POA: Diagnosis not present

## 2022-11-05 DIAGNOSIS — K31A12 Gastric intestinal metaplasia without dysplasia, involving the body (corpus): Secondary | ICD-10-CM | POA: Diagnosis not present

## 2022-11-05 DIAGNOSIS — D131 Benign neoplasm of stomach: Secondary | ICD-10-CM | POA: Diagnosis not present

## 2022-11-05 DIAGNOSIS — K2940 Chronic atrophic gastritis without bleeding: Secondary | ICD-10-CM | POA: Diagnosis not present

## 2022-11-05 DIAGNOSIS — K3189 Other diseases of stomach and duodenum: Secondary | ICD-10-CM | POA: Diagnosis not present

## 2022-11-07 DIAGNOSIS — D225 Melanocytic nevi of trunk: Secondary | ICD-10-CM | POA: Diagnosis not present

## 2022-11-07 DIAGNOSIS — L578 Other skin changes due to chronic exposure to nonionizing radiation: Secondary | ICD-10-CM | POA: Diagnosis not present

## 2022-11-07 DIAGNOSIS — L821 Other seborrheic keratosis: Secondary | ICD-10-CM | POA: Diagnosis not present

## 2022-11-07 DIAGNOSIS — D2371 Other benign neoplasm of skin of right lower limb, including hip: Secondary | ICD-10-CM | POA: Diagnosis not present

## 2022-11-07 DIAGNOSIS — Z85828 Personal history of other malignant neoplasm of skin: Secondary | ICD-10-CM | POA: Diagnosis not present

## 2022-11-12 DIAGNOSIS — K2940 Chronic atrophic gastritis without bleeding: Secondary | ICD-10-CM | POA: Diagnosis not present

## 2022-11-12 DIAGNOSIS — K2930 Chronic superficial gastritis without bleeding: Secondary | ICD-10-CM | POA: Diagnosis not present

## 2022-11-13 DIAGNOSIS — M25551 Pain in right hip: Secondary | ICD-10-CM | POA: Diagnosis not present

## 2022-11-13 DIAGNOSIS — M5416 Radiculopathy, lumbar region: Secondary | ICD-10-CM | POA: Diagnosis not present

## 2022-11-19 DIAGNOSIS — H5213 Myopia, bilateral: Secondary | ICD-10-CM | POA: Diagnosis not present

## 2022-11-19 DIAGNOSIS — Z961 Presence of intraocular lens: Secondary | ICD-10-CM | POA: Diagnosis not present

## 2022-12-31 DIAGNOSIS — K31819 Angiodysplasia of stomach and duodenum without bleeding: Secondary | ICD-10-CM | POA: Diagnosis not present

## 2022-12-31 DIAGNOSIS — F3341 Major depressive disorder, recurrent, in partial remission: Secondary | ICD-10-CM | POA: Diagnosis not present

## 2023-01-13 ENCOUNTER — Ambulatory Visit
Admission: RE | Admit: 2023-01-13 | Discharge: 2023-01-13 | Disposition: A | Discharge status: Home / Self Care | Payer: Medicare PPO | Source: Ambulatory Visit | Attending: Family Medicine | Admitting: Family Medicine

## 2023-01-13 DIAGNOSIS — E349 Endocrine disorder, unspecified: Secondary | ICD-10-CM | POA: Diagnosis not present

## 2023-01-13 DIAGNOSIS — E2839 Other primary ovarian failure: Secondary | ICD-10-CM

## 2023-01-13 DIAGNOSIS — N959 Unspecified menopausal and perimenopausal disorder: Secondary | ICD-10-CM | POA: Diagnosis not present

## 2023-01-14 DIAGNOSIS — M25569 Pain in unspecified knee: Secondary | ICD-10-CM | POA: Diagnosis not present

## 2023-01-14 DIAGNOSIS — K31819 Angiodysplasia of stomach and duodenum without bleeding: Secondary | ICD-10-CM | POA: Diagnosis not present

## 2023-01-14 DIAGNOSIS — M79606 Pain in leg, unspecified: Secondary | ICD-10-CM | POA: Diagnosis not present

## 2023-01-14 DIAGNOSIS — E559 Vitamin D deficiency, unspecified: Secondary | ICD-10-CM | POA: Diagnosis not present

## 2023-01-14 DIAGNOSIS — M791 Myalgia, unspecified site: Secondary | ICD-10-CM | POA: Diagnosis not present

## 2023-01-14 DIAGNOSIS — M2550 Pain in unspecified joint: Secondary | ICD-10-CM | POA: Diagnosis not present

## 2023-01-28 DIAGNOSIS — W540XXA Bitten by dog, initial encounter: Secondary | ICD-10-CM | POA: Diagnosis not present

## 2023-01-28 DIAGNOSIS — S50872A Other superficial bite of left forearm, initial encounter: Secondary | ICD-10-CM | POA: Diagnosis not present

## 2023-02-01 DIAGNOSIS — W540XXD Bitten by dog, subsequent encounter: Secondary | ICD-10-CM | POA: Diagnosis not present

## 2023-02-01 DIAGNOSIS — S51852D Open bite of left forearm, subsequent encounter: Secondary | ICD-10-CM | POA: Diagnosis not present

## 2023-02-19 ENCOUNTER — Other Ambulatory Visit (HOSPITAL_COMMUNITY): Payer: Self-pay | Admitting: Gastroenterology

## 2023-02-19 DIAGNOSIS — K31819 Angiodysplasia of stomach and duodenum without bleeding: Secondary | ICD-10-CM | POA: Diagnosis not present

## 2023-02-19 DIAGNOSIS — K2930 Chronic superficial gastritis without bleeding: Secondary | ICD-10-CM | POA: Diagnosis not present

## 2023-02-19 DIAGNOSIS — K31A19 Gastric intestinal metaplasia without dysplasia, unspecified site: Secondary | ICD-10-CM | POA: Diagnosis not present

## 2023-02-19 DIAGNOSIS — K2940 Chronic atrophic gastritis without bleeding: Secondary | ICD-10-CM | POA: Diagnosis not present

## 2023-02-19 DIAGNOSIS — K31A12 Gastric intestinal metaplasia without dysplasia, involving the body (corpus): Secondary | ICD-10-CM | POA: Diagnosis not present

## 2023-02-19 DIAGNOSIS — D3A8 Other benign neuroendocrine tumors: Secondary | ICD-10-CM

## 2023-02-24 ENCOUNTER — Ambulatory Visit (HOSPITAL_BASED_OUTPATIENT_CLINIC_OR_DEPARTMENT_OTHER): Payer: Medicare PPO

## 2023-02-24 DIAGNOSIS — Z79899 Other long term (current) drug therapy: Secondary | ICD-10-CM | POA: Diagnosis not present

## 2023-02-26 DIAGNOSIS — K2940 Chronic atrophic gastritis without bleeding: Secondary | ICD-10-CM | POA: Diagnosis not present

## 2023-02-26 DIAGNOSIS — K31A19 Gastric intestinal metaplasia without dysplasia, unspecified site: Secondary | ICD-10-CM | POA: Diagnosis not present

## 2023-02-26 DIAGNOSIS — K2930 Chronic superficial gastritis without bleeding: Secondary | ICD-10-CM | POA: Diagnosis not present

## 2023-03-03 ENCOUNTER — Ambulatory Visit (HOSPITAL_COMMUNITY)
Admission: RE | Admit: 2023-03-03 | Discharge: 2023-03-03 | Disposition: A | Discharge status: Home / Self Care | Payer: Medicare PPO | Source: Ambulatory Visit | Attending: Gastroenterology | Admitting: Gastroenterology

## 2023-03-03 ENCOUNTER — Encounter (HOSPITAL_COMMUNITY): Payer: Self-pay

## 2023-03-03 DIAGNOSIS — C7A8 Other malignant neuroendocrine tumors: Secondary | ICD-10-CM | POA: Diagnosis not present

## 2023-03-03 DIAGNOSIS — K31819 Angiodysplasia of stomach and duodenum without bleeding: Secondary | ICD-10-CM | POA: Diagnosis not present

## 2023-03-03 DIAGNOSIS — D3A8 Other benign neuroendocrine tumors: Secondary | ICD-10-CM | POA: Diagnosis not present

## 2023-03-03 DIAGNOSIS — K5730 Diverticulosis of large intestine without perforation or abscess without bleeding: Secondary | ICD-10-CM | POA: Diagnosis not present

## 2023-03-03 DIAGNOSIS — K760 Fatty (change of) liver, not elsewhere classified: Secondary | ICD-10-CM | POA: Diagnosis not present

## 2023-03-03 MED ORDER — IOHEXOL 300 MG/ML  SOLN
100.0000 mL | Freq: Once | INTRAMUSCULAR | Status: AC | PRN
  Administered 2023-03-03: 100 mL via INTRAVENOUS

## 2023-03-03 MED ORDER — IOHEXOL 9 MG/ML PO SOLN
ORAL | Status: AC
  Filled 2023-03-03: qty 1000

## 2023-03-03 MED ORDER — SODIUM CHLORIDE (PF) 0.9 % IJ SOLN
INTRAMUSCULAR | Status: AC
  Filled 2023-03-03: qty 50

## 2023-03-03 MED ORDER — IOHEXOL 9 MG/ML PO SOLN
1000.0000 mL | ORAL | Status: AC
  Administered 2023-03-03: 1000 mL via ORAL

## 2023-03-25 ENCOUNTER — Other Ambulatory Visit: Payer: Self-pay

## 2023-03-25 ENCOUNTER — Ambulatory Visit: Payer: Medicare PPO | Attending: Family Medicine | Admitting: Physical Therapy

## 2023-03-25 DIAGNOSIS — R2681 Unsteadiness on feet: Secondary | ICD-10-CM | POA: Diagnosis not present

## 2023-03-25 DIAGNOSIS — R42 Dizziness and giddiness: Secondary | ICD-10-CM | POA: Insufficient documentation

## 2023-03-25 NOTE — Therapy (Signed)
OUTPATIENT PHYSICAL THERAPY VESTIBULAR EVALUATION     Patient Name: Leah Mathews MRN: [id number] DOB:[birth year], 72 y.o., female Today's Date: 03/25/2023  END OF SESSION:  PT End of Session - 03/25/23 1543     Visit Number 1    Number of Visits 5    Date for PT Re-Evaluation 04/17/23    Authorization Type [employer] Medicare-auth submitted upon completion of eval    PT Start Time 1403    PT Stop Time 1446    PT Time Calculation (min) 43 min    Activity Tolerance Patient tolerated treatment well    Behavior During Therapy WFL for tasks assessed/performed             Past Medical History:  Diagnosis Date   Asthma    Chronic back pain    Depression    Epilepsy (HCC)    childhood   Neuralgia    Peripheral neuropathic pain    Past Surgical History:  Procedure Laterality Date   cataract surgery Left 2017   COLONOSCOPY     ECTOPIC PREGNANCY SURGERY  1982   wisdom teeth removal     Patient Active Problem List   Diagnosis Date Noted   Vaccine or biological substance causing adverse effect in therapeutic use, subsequent encounter 11/25/2019   Need for prophylactic vaccination against Streptococcus pneumoniae (pneumococcus) 11/25/2019    PCP: Shon Hale, MD REFERRING PROVIDER: Shon Hale, MD  REFERRING DIAG: H81.10 (ICD-10-CM) - Benign paroxysmal vertigo, unspecified ear   THERAPY DIAG:  Dizziness and giddiness  Unsteadiness on feet  ONSET DATE: 03/24/2023 (MD referral)  Rationale for Evaluation and Treatment: Rehabilitation  SUBJECTIVE:   SUBJECTIVE STATEMENT: See below Pt accompanied by: self  PERTINENT HISTORY: gastrointestinal issues (GAVE), HOH  PAIN:  Are you having pain? Yes: NPRS scale: "a lot of pain"/10 Pain location: hip, knee, back, foot Pain description: arthritis pain Aggravating factors: walking Relieving factors: uses walking stick, acetominophen  PRECAUTIONS: None  RED FLAGS: None   WEIGHT BEARING  RESTRICTIONS: No  FALLS: Has patient fallen in last 6 months? No  LIVING ENVIRONMENT: Lives with: lives alone Lives in: House/apartment Stairs: Has steps with rails Has following equipment at home:  walking pole  PLOF: Independent  PATIENT GOALS: Pt's goals for therapy are to fix the dizziness problem  OBJECTIVE:   DIAGNOSTIC FINDINGS: NA  COGNITION: Overall cognitive status: Within functional limits for tasks assessed   Cervical ROM:  WFL  BED MOBILITY:  Independent  TRANSFERS: Assistive device utilized: None  Sit to stand: Modified independence Stand to sit: Modified independence  GAIT: Gait pattern: antalgic Distance walked: 60 ft Assistive device utilized: None Level of assistance: Modified independence Comments: Reports pain in low back, hips, knees, ball of R foot-seems guarded due to pain  PATIENT SURVEYS:  FOTO 46 intake; 60 predicted  VESTIBULAR ASSESSMENT:  GENERAL OBSERVATION: No acute distress.   SYMPTOM BEHAVIOR:  Subjective history: When I turn over to right side, or from lying down to sitting up, my head just is "swimming".  Have had vertigo episodes (and Epley) in the past (>7 years ago).  This has been happening for about a month.  Non-Vestibular symptoms: tinnitus and hx of being bit by a dog and on antibiotics early July, wears hearing aids   Type of dizziness: Spinning/Vertigo and "Swimmyheaded"  Frequency:  daily  Duration: seconds  Aggravating factors: Induced by position change: rolling to the right and supine to sit  Relieving factors: head stationary, closing eyes,  and slow movements  Progression of symptoms: better  OCULOMOTOR EXAM:  Ocular Alignment: normal  Ocular ROM: No Limitations  Spontaneous Nystagmus: absent  Gaze-Induced Nystagmus: absent  Smooth Pursuits: intact  Saccades: intact  Convergence/Divergence: 2 cm    VESTIBULAR - OCULAR REFLEX:   Slow VOR: Normal  VOR Cancellation: Normal  Head-Impulse Test: HIT Right:  negative HIT Left: negative  Dynamic Visual Acuity:  NT   POSITIONAL TESTING: Right Dix-Hallpike: upbeating, right nystagmus and Duration: 15 Left Dix-Hallpike: reports dizziness and Duration: 10-15 sec Right Roll Test: no nystagmus Left Roll Test: no nystagmus With R roll test, pt feels slight spinning, no nystagmus    VESTIBULAR TREATMENT:                                                                                                   DATE: 03/25/2023  Canalith Repositioning:  Epley Right: Number of Reps: 2, Response to Treatment: symptoms resolved, and Comment: much less nystagmus 2nd trial  PATIENT EDUCATION: Education details: Eval results, POC Person educated: Patient Education method: Explanation and Demonstration Education comprehension: verbalized understanding  HOME EXERCISE PROGRAM:  GOALS: Goals reviewed with patient? Yes  SHORT TERM GOALS: = LTGs  LONG TERM GOALS: Target date: 04/17/2023  Pt will be independent with HEP for improved dizziness. Baseline:  Goal status: INITIAL  2.  FOTO score is to improve to at least 60 to demo improved dizziness. Baseline: 45 Goal status: INITIAL  3.  Pt will report 0/10 dizziness with bed mobility. Baseline: dizzy with roll to R and with supine>sit Goal status: INITIAL   ASSESSMENT:  CLINICAL IMPRESSION: Patient is a 72 y.o. female who was seen today for physical therapy evaluation and treatment for BPPV.   She reports about a month of spinning sensation with rolling to the right in bed and sometimes with getting up to sit from the bed.  She reports it typically lasts seconds and is resolved by closing her eyes or keeping head steady.  She has had hx of vertigo in the past (>7 years ago) and was resolved with Epley maneuver.  She denies any falls, head injury precipitating this new onset of vertigo.  With visual and oculomotor testing, these measures are WNL and do not provoke symptoms.  She has some dizziness with L  Dix-Hallpike and with R roll test, though no nystagmus noted.  She does have approximately 15 second intense spinning with R upbeating rotary nystagmus with R Dix-Hallpike, indicating R posterior canal BPPV.  Performed Epley x 2, with symptoms resolving and with decreased/nearly no nystagmus on the second trial.  She will continue to benefit from skilled PT to address positional vertigo to resolve dizziness for improved bed mobility and functional activities.  OBJECTIVE IMPAIRMENTS: dizziness.   ACTIVITY LIMITATIONS: sleeping and bed mobility  PARTICIPATION LIMITATIONS:  safety with bed mobility (pt lives alone)  PERSONAL FACTORS: 1-2 comorbidities: see above; hx of BPPV  are also affecting patient's functional outcome.   REHAB POTENTIAL: Good  CLINICAL DECISION MAKING: Stable/uncomplicated  EVALUATION COMPLEXITY: Low   PLAN:  PT FREQUENCY: 1-2x/week  PT  DURATION: 4 weeks including eval week  PLANNED INTERVENTIONS: Therapeutic exercises, Therapeutic activity, Neuromuscular re-education, Patient/Family education, Self Care, Vestibular training, and Canalith repositioning  PLAN FOR NEXT SESSION: Reassess BPPV and treat as needed; assess any balance measures as needed for vestibular system retraining   Kevis Qu W., PT 03/25/2023, 3:46 PM   [facility] [address], [address] [place], [place] [address] Phone # [phone] Fax # [phone]  Referring diagnosis? H81.10 (ICD-10-CM) - Benign paroxysmal vertigo, unspecified ear Treatment diagnosis? (if different than referring diagnosis)  Dizziness and giddiness  Unsteadiness on feet What was this (referring dx) caused by? []  Surgery []  Fall []  Ongoing issue []  Arthritis [x]  Other: ____Episode of vertigo________  Laterality: [x]  Rt []  Lt []  Both  Check all possible CPT codes:  *CHOOSE 10 OR LESS*    [x]  97110 (Therapeutic Exercise)  []  92507 (SLP Treatment)  [x]  97112  (Neuro Re-ed)   []  92526 (Swallowing Treatment)   []  97116 (Gait Training)   []  [id number] (Cognitive Training, 1st 15 minutes) []  97140 (Manual Therapy)   []  97130 (Cognitive Training, each add'l 15 minutes)  []  13086 (Re-evaluation)                              [x]  Other, List CPT Code [phone] _Canalith Repositioning Manuever_________  [x]  97530 (Therapeutic Activities)     [x]  97535 (Self Care)   []  All codes above (97110 - 97535)  []  97012 (Mechanical Traction)  []  97014 (E-stim Unattended)  []  97032 (E-stim manual)  []  97033 (Ionto)  []  97035 (Ultrasound) []  97750 (Physical Performance Training) []  [id number] (Aquatic Therapy) []  97016 (Vasopneumatic Device) []  [id number] (Paraffin) []  97034 (Contrast Bath) []  97597 (Wound Care 1st 20 sq cm) []  97598 (Wound Care each add'l 20 sq cm) []  97760 (Orthotic Fabrication, Fitting, Training Initial) []  [id number] (Prosthetic Management and Training Initial) []  [id number] (Orthotic or Prosthetic Training/ Modification Subsequent)

## 2023-03-27 ENCOUNTER — Encounter: Payer: Self-pay | Admitting: Physical Therapy

## 2023-03-27 ENCOUNTER — Ambulatory Visit: Payer: Medicare PPO | Admitting: Physical Therapy

## 2023-03-27 DIAGNOSIS — R2681 Unsteadiness on feet: Secondary | ICD-10-CM

## 2023-03-27 DIAGNOSIS — R42 Dizziness and giddiness: Secondary | ICD-10-CM | POA: Diagnosis not present

## 2023-03-27 NOTE — Therapy (Signed)
OUTPATIENT PHYSICAL THERAPY VESTIBULAR TREATMENT     Patient Name: Leah Mathews MRN: [id number] DOB:1950-08-03, 72 y.o., female Today's Date: 03/27/2023  END OF SESSION:  PT End of Session - 03/27/23 0932     Visit Number 2    Number of Visits 5    Date for PT Re-Evaluation 04/17/23    Authorization Type [employer] Medicare-auth submitted upon completion of eval    PT Start Time 0933    PT Stop Time 1003    PT Time Calculation (min) 30 min    Activity Tolerance Patient tolerated treatment well    Behavior During Therapy [facility] for tasks assessed/performed              Past Medical History:  Diagnosis Date   Asthma    Chronic back pain    Depression    Epilepsy (HCC)    childhood   Neuralgia    Peripheral neuropathic pain    Past Surgical History:  Procedure Laterality Date   cataract surgery Left 2017   COLONOSCOPY     ECTOPIC PREGNANCY SURGERY  1982   wisdom teeth removal     Patient Active Problem List   Diagnosis Date Noted   Vaccine or biological substance causing adverse effect in therapeutic use, subsequent encounter 11/25/2019   Need for prophylactic vaccination against Streptococcus pneumoniae (pneumococcus) 11/25/2019    PCP: Shon Hale, MD REFERRING PROVIDER: Shon Hale, MD  REFERRING DIAG: H81.10 (ICD-10-CM) - Benign paroxysmal vertigo, unspecified ear   THERAPY DIAG:  Dizziness and giddiness  Unsteadiness on feet  ONSET DATE: 03/24/2023 (MD referral)  Rationale for Evaluation and Treatment: Rehabilitation  SUBJECTIVE:   SUBJECTIVE STATEMENT: I just feel off.  I rolled through the night and felt the dizziness.  Balance has been off since last visit. Pt accompanied by: self  PERTINENT HISTORY: gastrointestinal issues (GAVE), HOH  PAIN:  Are you having pain? Yes: NPRS scale: "a lot of pain"/10 Pain location: hip, knee, back, foot Pain description: arthritis pain Aggravating factors: walking Relieving factors: uses  walking stick, acetominophen  PRECAUTIONS: None  RED FLAGS: None   WEIGHT BEARING RESTRICTIONS: No  FALLS: Has patient fallen in last 6 months? No  LIVING ENVIRONMENT: Lives with: lives alone Lives in: House/apartment Stairs: Has steps with rails Has following equipment at home:  walking pole  PLOF: Independent  PATIENT GOALS: Pt's goals for therapy are to fix the dizziness problem  OBJECTIVE:    TODAY'S TREATMENT: 03/27/2023 Activity Comments  R DH Positive upbeating rotary nystagmus to R; 10 seconds, stronger feeling of dizziness  L DH Positve upbeating rotary nystagmus to L, >30 sec  R Epley manuever Tolerated well  R DH>R Epley manuever Less nystagmus, less symptoms; tolerated treatment well          M-CTSIB  Condition 1: Firm Surface, EO 30 Sec, Normal Sway  Condition 2: Firm Surface, EC 30 Sec, Mild Sway  Condition 3: Foam Surface, EO 30 Sec, Normal Sway  Condition 4: Foam Surface, EC 30 Sec, Mild and Moderate Sway    PATIENT EDUCATION: Education details: Rationale for BPPV treatment (had symptoms worse on R than L); discussed options for Brandt-Daroff to perform at home for habituation, with pt wanting to wait on this, as she lives alone and doesn't want to bring anything on that she cannot handle by herself Person educated: Patient Education method: Explanation Education comprehension: verbalized understanding   -------------------------------------------------- Objective measures below taken at initial evaluation:  DIAGNOSTIC FINDINGS: NA  COGNITION: Overall cognitive status: Within functional limits for tasks assessed   Cervical ROM:  WFL  BED MOBILITY:  Independent  TRANSFERS: Assistive device utilized: None  Sit to stand: Modified independence Stand to sit: Modified independence  GAIT: Gait pattern: antalgic Distance walked: 60 ft Assistive device utilized: None Level of assistance: Modified independence Comments: Reports pain in low  back, hips, knees, ball of R foot-seems guarded due to pain  PATIENT SURVEYS:  FOTO 46 intake; 60 predicted  VESTIBULAR ASSESSMENT:  GENERAL OBSERVATION: No acute distress.   SYMPTOM BEHAVIOR:  Subjective history: When I turn over to right side, or from lying down to sitting up, my head just is "swimming".  Have had vertigo episodes (and Epley) in the past (>7 years ago).  This has been happening for about a month.  Non-Vestibular symptoms: tinnitus and hx of being bit by a dog and on antibiotics early July, wears hearing aids   Type of dizziness: Spinning/Vertigo and "Swimmyheaded"  Frequency:  daily  Duration: seconds  Aggravating factors: Induced by position change: rolling to the right and supine to sit  Relieving factors: head stationary, closing eyes, and slow movements  Progression of symptoms: better  OCULOMOTOR EXAM:  Ocular Alignment: normal  Ocular ROM: No Limitations  Spontaneous Nystagmus: absent  Gaze-Induced Nystagmus: absent  Smooth Pursuits: intact  Saccades: intact  Convergence/Divergence: 2 cm    VESTIBULAR - OCULAR REFLEX:   Slow VOR: Normal  VOR Cancellation: Normal  Head-Impulse Test: HIT Right: negative HIT Left: negative  Dynamic Visual Acuity:  NT   POSITIONAL TESTING: Right Dix-Hallpike: upbeating, right nystagmus and Duration: 15 Left Dix-Hallpike: reports dizziness and Duration: 10-15 sec Right Roll Test: no nystagmus Left Roll Test: no nystagmus With R roll test, pt feels slight spinning, no nystagmus    VESTIBULAR TREATMENT:                                                                                                   DATE: 03/25/2023  Canalith Repositioning:  Epley Right: Number of Reps: 2, Response to Treatment: symptoms resolved, and Comment: much less nystagmus 2nd trial  PATIENT EDUCATION: Education details: Eval results, POC Person educated: Patient Education method: Explanation and Demonstration Education comprehension:  verbalized understanding  HOME EXERCISE PROGRAM:  GOALS: Goals reviewed with patient? Yes  SHORT TERM GOALS: = LTGs  LONG TERM GOALS: Target date: 04/17/2023  Pt will be independent with HEP for improved dizziness. Baseline:  Goal status: INITIAL  2.  FOTO score is to improve to at least 60 to demo improved dizziness. Baseline: 45 Goal status: INITIAL  3.  Pt will report 0/10 dizziness with bed mobility. Baseline: dizzy with roll to R and with supine>sit Goal status: INITIAL   ASSESSMENT:  CLINICAL IMPRESSION: Reassessed positional vertigo today, with pt feeling spinning with L DH, with some nystagmus noted int his position today.  Positive R DH with definite nystagmus and definite spinning 10 seconds.  Treated for R BPPV, as pt had more significant symptoms with this side.  Performed x 2, with no nystagmus/very minimal symptoms noted at second  trial.  Pt is continuing to feel dizziness with R rolling at home and reporting increased unsteadiness overall (she reports this is similar to her previous bout of positional vertigo years ago).  Discussed options for habituation, continuing to try to resolve positional vertigo with Brandt-Daroff exercise; however, pt wants to hold on this, as she doesn't want to bring on any significant symptoms with her living alone. Will continue to assess and treat as appropriate to help to resolve dizziness.    OBJECTIVE IMPAIRMENTS: dizziness.   ACTIVITY LIMITATIONS: sleeping and bed mobility  PARTICIPATION LIMITATIONS:  safety with bed mobility (pt lives alone)  PERSONAL FACTORS: 1-2 comorbidities: see above; hx of BPPV  are also affecting patient's functional outcome.   REHAB POTENTIAL: Good  CLINICAL DECISION MAKING: Stable/uncomplicated  EVALUATION COMPLEXITY: Low   PLAN:  PT FREQUENCY: 1-2x/week  PT DURATION: 4 weeks including eval week  PLANNED INTERVENTIONS: Therapeutic exercises, Therapeutic activity, Neuromuscular re-education,  Patient/Family education, Self Care, Vestibular training, and Canalith repositioning  PLAN FOR NEXT SESSION: Reassess BPPV and treat as needed; consider checking again roll test and to proceed with Brandt-Daroff.; assess FGA as needed for vestibular system retraining   Terran Klinke W., PT 03/27/2023, 10:04 AM   [facility] at [facility] [address], [address] [place], [place] [address] Phone # [phone] Fax # [phone]

## 2023-04-06 ENCOUNTER — Ambulatory Visit: Payer: Medicare PPO | Attending: Family Medicine | Admitting: Physical Therapy

## 2023-04-06 ENCOUNTER — Encounter: Payer: Self-pay | Admitting: Physical Therapy

## 2023-04-06 DIAGNOSIS — R42 Dizziness and giddiness: Secondary | ICD-10-CM | POA: Insufficient documentation

## 2023-04-06 DIAGNOSIS — R2681 Unsteadiness on feet: Secondary | ICD-10-CM | POA: Insufficient documentation

## 2023-04-06 NOTE — Therapy (Signed)
OUTPATIENT PHYSICAL THERAPY VESTIBULAR TREATMENT     Patient Name: Leah Mathews MRN: [id number] DOB:02/16/1951, 72 y.o., female Today's Date: 04/06/2023  END OF SESSION:  PT End of Session - 04/06/23 1448     Visit Number 3    Number of Visits 5    Date for PT Re-Evaluation 04/17/23    Authorization Type [employer] Medicare-auth submitted upon completion of eval    PT Start Time 1448    Activity Tolerance Patient tolerated treatment well    Behavior During Therapy [facility] for tasks assessed/performed               Past Medical History:  Diagnosis Date   Asthma    Chronic back pain    Depression    Epilepsy (HCC)    childhood   Neuralgia    Peripheral neuropathic pain    Past Surgical History:  Procedure Laterality Date   cataract surgery Left 2017   COLONOSCOPY     ECTOPIC PREGNANCY SURGERY  1982   wisdom teeth removal     Patient Active Problem List   Diagnosis Date Noted   Vaccine or biological substance causing adverse effect in therapeutic use, subsequent encounter 11/25/2019   Need for prophylactic vaccination against Streptococcus pneumoniae (pneumococcus) 11/25/2019    PCP: Shon Hale, MD REFERRING PROVIDER: Shon Hale, MD  REFERRING DIAG: H81.10 (ICD-10-CM) - Benign paroxysmal vertigo, unspecified ear   THERAPY DIAG:  Dizziness and giddiness  Unsteadiness on feet  ONSET DATE: 03/24/2023 (MD referral)  Rationale for Evaluation and Treatment: Rehabilitation  SUBJECTIVE:   SUBJECTIVE STATEMENT: Feel that I have had 2 days of being off balance after being in therapy.  Don't feel like it's any better.  When I reach up and look down, I have to catch my balance.  Pt accompanied by: self  PERTINENT HISTORY: gastrointestinal issues (GAVE), HOH  PAIN:  Are you having pain? Yes: NPRS scale: "a lot of pain"/10 Pain location: hip, knee, back, foot Pain description: arthritis pain Aggravating factors: walking Relieving factors:  uses walking stick, acetominophen  PRECAUTIONS: None  RED FLAGS: None   WEIGHT BEARING RESTRICTIONS: No  FALLS: Has patient fallen in last 6 months? No  LIVING ENVIRONMENT: Lives with: lives alone Lives in: House/apartment Stairs: Has steps with rails Has following equipment at home:  walking pole  PLOF: Independent  PATIENT GOALS: Pt's goals for therapy are to fix the dizziness problem  OBJECTIVE:    TODAY'S TREATMENT: 04/06/2023 Activity Comments  R sidelying test /Brandt-Daroff Nystagmus and spinning 2/10 Rates 4/10 upon return to sit  Brandt-Daroff-2nd trial Spinning 4/10, 6/10 upon return to sit  R DH Nystagmus and spinning noted <10-15 sec  R Epley Felt spinning in 2nd position Felt significant spinning and pulling and off balance in return to sit           TODAY'S TREATMENT: 03/27/2023 Activity Comments  R DH Positive upbeating rotary nystagmus to R; 10 seconds, stronger feeling of dizziness  L DH Positve upbeating rotary nystagmus to L, >30 sec  R Epley manuever Tolerated well  R DH>R Epley manuever Less nystagmus, less symptoms; tolerated treatment well          M-CTSIB  Condition 1: Firm Surface, EO 30 Sec, Normal Sway  Condition 2: Firm Surface, EC 30 Sec, Mild Sway  Condition 3: Foam Surface, EO 30 Sec, Normal Sway  Condition 4: Foam Surface, EC 30 Sec, Mild and Moderate Sway    PATIENT EDUCATION: Education details:  Rationale for BPPV treatment (had symptoms worse on R than L); discussed options for Brandt-Daroff to perform at home for habituation, with pt wanting to wait on this, as she lives alone and doesn't want to bring anything on that she cannot handle by herself Person educated: Patient Education method: Explanation Education comprehension: verbalized understanding   -------------------------------------------------- Objective measures below taken at initial evaluation:  DIAGNOSTIC FINDINGS: NA  COGNITION: Overall cognitive status:  Within functional limits for tasks assessed   Cervical ROM:  WFL  BED MOBILITY:  Independent  TRANSFERS: Assistive device utilized: None  Sit to stand: Modified independence Stand to sit: Modified independence  GAIT: Gait pattern: antalgic Distance walked: 60 ft Assistive device utilized: None Level of assistance: Modified independence Comments: Reports pain in low back, hips, knees, ball of R foot-seems guarded due to pain  PATIENT SURVEYS:  FOTO 46 intake; 60 predicted  VESTIBULAR ASSESSMENT:  GENERAL OBSERVATION: No acute distress.   SYMPTOM BEHAVIOR:  Subjective history: When I turn over to right side, or from lying down to sitting up, my head just is "swimming".  Have had vertigo episodes (and Epley) in the past (>7 years ago).  This has been happening for about a month.  Non-Vestibular symptoms: tinnitus and hx of being bit by a dog and on antibiotics early July, wears hearing aids   Type of dizziness: Spinning/Vertigo and "Swimmyheaded"  Frequency:  daily  Duration: seconds  Aggravating factors: Induced by position change: rolling to the right and supine to sit  Relieving factors: head stationary, closing eyes, and slow movements  Progression of symptoms: better  OCULOMOTOR EXAM:  Ocular Alignment: normal  Ocular ROM: No Limitations  Spontaneous Nystagmus: absent  Gaze-Induced Nystagmus: absent  Smooth Pursuits: intact  Saccades: intact  Convergence/Divergence: 2 cm    VESTIBULAR - OCULAR REFLEX:   Slow VOR: Normal  VOR Cancellation: Normal  Head-Impulse Test: HIT Right: negative HIT Left: negative  Dynamic Visual Acuity:  NT   POSITIONAL TESTING: Right Dix-Hallpike: upbeating, right nystagmus and Duration: 15 Left Dix-Hallpike: reports dizziness and Duration: 10-15 sec Right Roll Test: no nystagmus Left Roll Test: no nystagmus With R roll test, pt feels slight spinning, no nystagmus    VESTIBULAR TREATMENT:                                                                                                    DATE: 03/25/2023  Canalith Repositioning:  Epley Right: Number of Reps: 2, Response to Treatment: symptoms resolved, and Comment: much less nystagmus 2nd trial  PATIENT EDUCATION: Education details: Eval results, POC Person educated: Patient Education method: Explanation and Demonstration Education comprehension: verbalized understanding  HOME EXERCISE PROGRAM:  GOALS: Goals reviewed with patient? Yes  SHORT TERM GOALS: = LTGs  LONG TERM GOALS: Target date: 04/17/2023  Pt will be independent with HEP for improved dizziness. Baseline:  Goal status: INITIAL  2.  FOTO score is to improve to at least 60 to demo improved dizziness. Baseline: 45 Goal status: INITIAL  3.  Pt will report 0/10 dizziness with bed mobility. Baseline: dizzy with roll to  R and with supine>sit Goal status: INITIAL   ASSESSMENT:  CLINICAL IMPRESSION: *** Reassessed positional vertigo today, with pt feeling spinning with L DH, with some nystagmus noted int his position today.  Positive R DH with definite nystagmus and definite spinning 10 seconds.  Treated for R BPPV, as pt had more significant symptoms with this side.  Performed x 2, with no nystagmus/very minimal symptoms noted at second trial.  Pt is continuing to feel dizziness with R rolling at home and reporting increased unsteadiness overall (she reports this is similar to her previous bout of positional vertigo years ago).  Discussed options for habituation, continuing to try to resolve positional vertigo with Brandt-Daroff exercise; however, pt wants to hold on this, as she doesn't want to bring on any significant symptoms with her living alone. Will continue to assess and treat as appropriate to help to resolve dizziness.    OBJECTIVE IMPAIRMENTS: dizziness.   ACTIVITY LIMITATIONS: sleeping and bed mobility  PARTICIPATION LIMITATIONS:  safety with bed mobility (pt lives alone)  PERSONAL  FACTORS: 1-2 comorbidities: see above; hx of BPPV  are also affecting patient's functional outcome.   REHAB POTENTIAL: Good  CLINICAL DECISION MAKING: Stable/uncomplicated  EVALUATION COMPLEXITY: Low   PLAN:  PT FREQUENCY: 1-2x/week  PT DURATION: 4 weeks including eval week  PLANNED INTERVENTIONS: Therapeutic exercises, Therapeutic activity, Neuromuscular re-education, Patient/Family education, Self Care, Vestibular training, and Canalith repositioning  PLAN FOR NEXT SESSION: ***Reassess BPPV and treat as needed; consider checking again roll test and to proceed with Brandt-Daroff.; assess FGA as needed for vestibular system retraining   Jacquelene Kopecky W., PT 04/06/2023, 2:48 PM   [facility] at [facility] [address] [place], [place] [address] Phone # [phone] Fax # [phone]

## 2023-04-09 DIAGNOSIS — M7061 Trochanteric bursitis, right hip: Secondary | ICD-10-CM | POA: Diagnosis not present

## 2023-04-22 ENCOUNTER — Ambulatory Visit: Payer: Medicare PPO | Admitting: Diagnostic Neuroimaging

## 2023-04-22 ENCOUNTER — Encounter: Payer: Self-pay | Admitting: Diagnostic Neuroimaging

## 2023-04-22 VITALS — BP 131/80 | HR 83 | Ht 64.75 in | Wt 154.0 lb

## 2023-04-22 DIAGNOSIS — H9313 Tinnitus, bilateral: Secondary | ICD-10-CM

## 2023-04-22 DIAGNOSIS — H8111 Benign paroxysmal vertigo, right ear: Secondary | ICD-10-CM

## 2023-04-22 DIAGNOSIS — H918X3 Other specified hearing loss, bilateral: Secondary | ICD-10-CM | POA: Diagnosis not present

## 2023-04-22 NOTE — Progress Notes (Signed)
[facility]  PATIENT: Leah Mathews DOB: Aug 22, 1950  REFERRING CLINICIAN: Shon Hale, *  HISTORY FROM: patient  REASON FOR VISIT: new consult    HISTORICAL  CHIEF COMPLAINT:  Chief Complaint  Patient presents with   Neurologic Problem    Pt alone, rm 7. A month ago she started to notice in her sleep but if she turned to her right side she would note there was , completed vestiublar rehab, she had that completed 4 x and it was starting to get worse. Her last visit she has a bad reaction. She had a episode of vertigo 15 yrs ago she was given meclizine and that did help but she completed eply maneuver which in the past had treated.    Peripheral Neuropathy    Pt still has concerns with numbness feet bilaterally. When she wakes up she has numbness in feet and hands bilaterally. Pt was seen in 2023 for this. Never had a MRI or NCV/EMG completed.     HISTORY OF PRESENT ILLNESS:   UPDATE (04/22/23, VRP): Since last visit, here for eval of vertigo. Initial event 15 years ago. Then had recurrence in vertigo recently 1 month ago, went to vestibular rehab for epley maneuvers, but seemed to be worse (previously helping). Also has hearing loss and tinnitus.   UPDATE (09/13/21, VRP): Since last visit, here for eval of numbness and pain in toes. Has been chronic since 2014, but more lately. Now found to have B12 and iron deficiency in last 3-6 months, now on replacement, and doing better. Some low back pain issues. Sxs worse in AM when waking up and improve through the day.   UPDATE 11/28/14: Since last visit, doing much better with hip/leg/back issues, esp with PT exercises and eval. Had 1 episode of right shoulder pain/spasm, but this resolved after 2 days of naproxen. Overall doing well.  UPDATE 07/26/14: Since last visit, continues to have intermittent pain in bilateral buttocks, radiating to the legs (back of thighs into calves). Symptoms worse with sitting for long  time. Some numbness at night time in hands. Has been less active lately. No weakness.   NEW HPI (05/10/14): Patient known to me from prior evaluations. However patient presents for new problem consisting of bilateral leg pain, restless legs, numbness. Approximately one year ago patient with onset of cramps, pain, uneasy sensation in the legs. More often she has right hip, right leg, right calf radiating pain, as compared to the left side. Over the past 1 month patient is noted intermittent jerking, uneasy restless sensation in the legs, especially she lays down to sleep at night. Sometimes this wakes her up from sleep. Patient has been taking magnesium supplements last 2 weeks which has significantly improved the symptoms. However she still has pain in the right leg, radiating downward. Patient was evaluated with iron studies, thyroid function testing, CBC which were unremarkable. Patient was prescribed Mirapex, but she has not tried this yet as the magnesium supplement seems to help her symptoms.  PRIOR HPI (09/26/13): 72 year old female here for evaluation of intermittent electrical zaps. Patient describes brief electrical charge sensation in the back of her head, mainly on the right side but sometimes on the left. Episodes last just for one second at a time. She has these events sporadically over the last 8-10 years. Several months ago these increased in frequency with more pain. These were occurring every minute at a time. Symptoms have decreased recently. Patient has tried over-the-counter medication without relief.  She was also prescribed Tegretol for possible trigeminal neuralgia which did not help. I previously saw patient in 2012 for a different problem, vertigo ringing in the ears and seizure disorder. These are improved. No further seizures. No further vertigo.  PRIOR HPI (12/16/10): 72 year old right-handed female with history of seizure disorder, anxiety, hearing impairment, shingles, here for  evaluation of vertigo, tinnitus and evaluation for seizure disorder. Age 37 years old - patient had a combination of grand mal seizures, drop attacks, myoclonic jerks, and was treated with Dilantin x 7 years.  She had no further sz once starting Dilantin.  Eventually she was taken off of Dilantin, and she has had no further seizures since then. April 2011 - developed right-sided "TMJ" pain and problems with cherwing; was seen by a dentist specialists and treated conservatively with heat packs.  Eventually this symptom subsided. Around the same time she developed intermittent positional vertigo (room spinning) with turning her head to the right.  This was also self-limited. Mar 2012 - developed well ringing sensation in her ears like a "freight train", and one morning woke up with a "burst blood vessel in her left eye". Patient reports history of hearing impairment since age 42 years old, and wearing hearing aids for the past 6 years.   REVIEW OF SYSTEMS: Full 14 system review of systems performed and notable only for nothing   ALLERGIES: Allergies  Allergen Reactions   Ciprofloxacin Rash   Sulfa Antibiotics Rash   Tetanus Toxoid Rash   Amitriptyline Other (See Comments)    Depression   Ibuprofen    Tizanidine Hcl     Other reaction(s): hallucinations   Ciprofloxacin Rash   Elemental Sulfur Rash   Tetanus Toxoids Rash    HOME MEDICATIONS: Outpatient Medications Prior to Visit  Medication Sig Dispense Refill   acetaminophen (TYLENOL) 500 MG tablet Take 500 mg by mouth every 6 (six) hours as needed.     albuterol (VENTOLIN HFA) 108 (90 Base) MCG/ACT inhaler Inhale 2 puffs into the lungs every 6 (six) hours as needed for wheezing or shortness of breath.     ALPRAZolam (XANAX) 0.25 MG tablet Take 0.25 mg by mouth daily as needed for anxiety (uses 1-2 times a year).     Calcium Carb-Cholecalciferol (CALCIUM 600 + D PO) Take 1 tablet by mouth daily.     carboxymethylcellulose (REFRESH PLUS) 0.5  % SOLN 1 drop 3 (three) times daily as needed.     Cyanocobalamin (VITAMIN B12) 1000 MCG TBCR Take 1 tablet by mouth daily.     Homeopathic Products [facility] JOINT RELIEF ROLL-ON EX) Apply topically.     Magnesium 250 MG TABS Take 1 tablet by mouth daily.     Omega-3 Fatty Acids (FISH OIL) 1200 MG CAPS Take 1,000 mg by mouth daily.     omeprazole (PRILOSEC OTC) 20 MG tablet Take 20 mg by mouth daily.     VITAMIN D PO Take 2,000 Units by mouth daily.     Omega-3 Fatty Acids (FISH OIL PO) Take 1 capsule by mouth daily. 1300 mg     No facility-administered medications prior to visit.    PAST MEDICAL HISTORY: Past Medical History:  Diagnosis Date   Asthma    Chronic back pain    Depression    Epilepsy (HCC)    childhood   Neuralgia    Peripheral neuropathic pain     PAST SURGICAL HISTORY: Past Surgical History:  Procedure Laterality Date   cataract surgery Left 2017  COLONOSCOPY     ECTOPIC PREGNANCY SURGERY  1982   wisdom teeth removal      FAMILY HISTORY: Family History  Problem Relation Age of Onset   Congestive Heart Failure Mother    Heart disease Father    COPD Sister     SOCIAL HISTORY:  Social History   Socioeconomic History   Marital status: Divorced    Spouse name: Not on file   Number of children: 2   Years of education: college   Highest education level: Not on file  Occupational History   Occupation: [employer]  Tobacco Use   Smoking status: Never   Smokeless tobacco: Never  Vaping Use   Vaping status: Never Used  Substance and Sexual Activity   Alcohol use: Yes    Comment: occasionally   Drug use: No   Sexual activity: Not on file  Other Topics Concern   Not on file  Social History Narrative    Patient lives at home with son.   Caffeine Use: 2 cups daily   Social Determinants of Health   Financial Resource Strain: Not on file  Food Insecurity: Not on file  Transportation Needs: Not on file  Physical Activity: Not on file  Stress: Not  on file  Social Connections: Not on file  Intimate Partner Violence: Not on file     PHYSICAL EXAM  GENERAL EXAM/CONSTITUTIONAL: Vitals:  Vitals:   04/22/23 1519  BP: 131/80  Pulse: 83  Weight: 154 lb (69.9 kg)  Height: 5' 4.75" (1.645 m)   Body mass index is 25.83 kg/m. Wt Readings from Last 3 Encounters:  04/22/23 154 lb (69.9 kg)  09/13/21 148 lb (67.1 kg)  07/26/21 147 lb (66.7 kg)   Patient is in no distress; well developed, nourished and groomed; neck is supple  CARDIOVASCULAR: Examination of carotid arteries is normal; no carotid bruits Regular rate and rhythm, no murmurs Examination of peripheral vascular system by observation and palpation is normal  EYES: Ophthalmoscopic exam of optic discs and posterior segments is normal; no papilledema or hemorrhages No results found.  MUSCULOSKELETAL: Gait, strength, tone, movements noted in Neurologic exam below  NEUROLOGIC: MENTAL STATUS:      No data to display         awake, alert, oriented to person, place and time recent and remote memory intact normal attention and concentration language fluent, comprehension intact, naming intact fund of knowledge appropriate  CRANIAL NERVE:  2nd - no papilledema on fundoscopic exam 2nd, 3rd, 4th, 6th - pupils equal and reactive to light, visual fields full to confrontation, extraocular muscles intact, no nystagmus 5th - facial sensation symmetric 7th - facial strength symmetric 8th - hearing intact 9th - palate elevates symmetrically, uvula midline 11th - shoulder shrug symmetric 12th - tongue protrusion midline  MOTOR:  normal bulk and tone, full strength in the BUE, BLE  SENSORY:  normal and symmetric to light touch, temperature, vibration  COORDINATION:  finger-nose-finger, fine finger movements normal  REFLEXES:  deep tendon reflexes trace and symmetric  GAIT/STATION:  narrow based gait     DIAGNOSTIC DATA (LABS, IMAGING, TESTING) - I reviewed  patient records, labs, notes, testing and imaging myself where available.  Lab Results  Component Value Date   WBC 3.6 (L) 07/26/2021   HGB 6.9 (LL) 07/26/2021   HCT 25.3 (L) 07/26/2021   MCV 68.8 (L) 07/26/2021   PLT 196 07/26/2021      Component Value Date/Time   NA 136 07/26/2021 1304  K 4.1 07/26/2021 1304   CL 107 07/26/2021 1304   CO2 24 07/26/2021 1304   GLUCOSE 107 (H) 07/26/2021 1304   BUN 15 07/26/2021 1304   CREATININE 0.66 07/26/2021 1304   CALCIUM 8.4 (L) 07/26/2021 1304   PROT 7.3 07/26/2021 1304   ALBUMIN 4.0 07/26/2021 1304   AST 33 07/26/2021 1304   ALT 28 07/26/2021 1304   ALKPHOS 56 07/26/2021 1304   BILITOT 1.0 07/26/2021 1304   GFRNONAA >60 07/26/2021 1304   GFRAA 55 (L) 06/09/2019 1630   No results found for: "CHOL", "HDL", "LDLCALC", "LDLDIRECT", "TRIG", "CHOLHDL" No results found for: "HGBA1C" No results found for: "VITAMINB12" No results found for: "TSH"  01/16/11 MRI brain - few non-specific foci of gliosis  01/16/11 MRA head - normal   ASSESSMENT AND PLAN  72 y.o. year old female here with:  Dx:   1. Benign paroxysmal positional vertigo of right ear   2. Other specified hearing loss of both ears   3. Tinnitus of both ears     PLAN:  VERTIGO, HEARING LOSS, TINNITUS (chronic, intermittent, progressive since ~2010) - some intermittent BPPV over the years; now could be meniere's dz - limit salt intake; avoid caffeine, alcohol - refer to ENT - consider diuretic or benzodiazepine for severe symptoms  NUMBNESS / PAIN IN TOES (since at least 2014; could be related to B12 deficiency, iron deficiency; lumbar radiculopathies) - continue B12 and iron replacements - considered MRI lumbar spine (ok to hold of as neuro exam unremarkable and patient not interested in surgery at this time) - considered EMG/NCS (ok to hold off as symptoms and neuro exam are unremarkable at this time, and likely to be low yield given chronicity of symptoms) -  continue physical exercises / therapy exercises; consider massage therapy, yoga, water therapy/swimming  Orders Placed This Encounter  Procedures   Ambulatory referral to ENT   No follow-ups on file.   Suanne Marker, MD 04/22/2023, 3:44 PM Certified in Neurology, Neurophysiology and Neuroimaging  [facility] [address], [address] [place], [place] [address] [phone]

## 2023-04-22 NOTE — Patient Instructions (Signed)
  VERTIGO, HEARING LOSS, TINNITUS (chronic, intermittent, progressive since ~2010) - some intermittent BPPV over the years; now could be meniere's dz - limit salt intake; avoid caffeine, alcohol - refer to ENT - consider diuretic or benzodiazepine for severe symptoms

## 2023-06-05 ENCOUNTER — Encounter: Payer: Self-pay | Admitting: Physical Therapy

## 2023-06-05 NOTE — Therapy (Signed)
[name] [address] [place], [place], [address] Phone: [phone]   Fax:  [phone]  Patient Details  Name: Leah Mathews MRN: [id number] Date of Birth: [birth year] Referring Provider:  No ref. provider found  Encounter Date: 06/05/2023  PHYSICAL THERAPY DISCHARGE SUMMARY  Visits from Start of Care: 3  Current functional level related to goals / functional outcomes: See eval   Remaining deficits: See eval-vestibular symptoms are not improving with several treatments; pt feels they are worsening and requests to cancel to follow up with MD.   Education / Equipment: HEP   Patient agrees to discharge. Patient goals were not met. Patient is being discharged due to not returning since the last visit.   [name] W., PT 06/05/2023, 8:35 AM  [facility] [address], [address] [place], [place], [address] Phone: [phone]   Fax:  [phone]

## 2023-06-29 DIAGNOSIS — M545 Low back pain, unspecified: Secondary | ICD-10-CM | POA: Diagnosis not present

## 2023-06-29 DIAGNOSIS — M25551 Pain in right hip: Secondary | ICD-10-CM | POA: Diagnosis not present

## 2023-08-04 DIAGNOSIS — E7800 Pure hypercholesterolemia, unspecified: Secondary | ICD-10-CM | POA: Diagnosis not present

## 2023-08-04 DIAGNOSIS — E538 Deficiency of other specified B group vitamins: Secondary | ICD-10-CM | POA: Diagnosis not present

## 2023-08-04 DIAGNOSIS — M792 Neuralgia and neuritis, unspecified: Secondary | ICD-10-CM | POA: Diagnosis not present

## 2023-08-04 DIAGNOSIS — Z0000 Encounter for general adult medical examination without abnormal findings: Secondary | ICD-10-CM | POA: Diagnosis not present

## 2023-08-04 DIAGNOSIS — F419 Anxiety disorder, unspecified: Secondary | ICD-10-CM | POA: Diagnosis not present

## 2023-08-04 DIAGNOSIS — M8589 Other specified disorders of bone density and structure, multiple sites: Secondary | ICD-10-CM | POA: Diagnosis not present

## 2023-08-04 DIAGNOSIS — Z79899 Other long term (current) drug therapy: Secondary | ICD-10-CM | POA: Diagnosis not present

## 2023-08-04 DIAGNOSIS — Z887 Allergy status to serum and vaccine status: Secondary | ICD-10-CM | POA: Diagnosis not present

## 2023-08-04 DIAGNOSIS — R5383 Other fatigue: Secondary | ICD-10-CM | POA: Diagnosis not present

## 2023-08-12 ENCOUNTER — Ambulatory Visit (INDEPENDENT_AMBULATORY_CARE_PROVIDER_SITE_OTHER): Payer: Medicare PPO

## 2023-08-12 ENCOUNTER — Encounter: Payer: Self-pay | Admitting: Podiatry

## 2023-08-12 ENCOUNTER — Ambulatory Visit: Payer: Medicare PPO | Admitting: Podiatry

## 2023-08-12 DIAGNOSIS — M722 Plantar fascial fibromatosis: Secondary | ICD-10-CM

## 2023-08-12 DIAGNOSIS — G629 Polyneuropathy, unspecified: Secondary | ICD-10-CM

## 2023-08-12 DIAGNOSIS — M7752 Other enthesopathy of left foot: Secondary | ICD-10-CM

## 2023-08-12 DIAGNOSIS — M205X1 Other deformities of toe(s) (acquired), right foot: Secondary | ICD-10-CM

## 2023-08-12 MED ORDER — TRIAMCINOLONE ACETONIDE 10 MG/ML IJ SUSP
10.0000 mg | Freq: Once | INTRAMUSCULAR | Status: AC
Start: 2023-08-12 — End: 2023-08-12
  Administered 2023-08-12: 10 mg via INTRA_ARTICULAR

## 2023-08-12 NOTE — Progress Notes (Addendum)
 Subjective:   Patient ID: Leah Mathews, female   DOB: 73 y.o.   MRN: [id number]   HPI Patient states she has a lot of pain in her heel right and on the left she has got a lesion formation and inflammation around the fifth metatarsal head.  States both areas are sore.  Patient does not smoke likes to be active   Review of Systems  All other systems reviewed and are negative.       Objective:  Physical Exam Vitals and nursing note reviewed.  Constitutional:      Appearance: She is well-developed.  Pulmonary:     Effort: Pulmonary effort is normal.  Musculoskeletal:        General: Normal range of motion.  Skin:    General: Skin is warm.  Neurological:     Mental Status: She is alert.     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  Today I did sterile prep injected fifth MPJ 3 mg dexamethasone  Kenalog  5 mg Xylocaine did sterile prep and injected the plantar fascia right 3 mg Kenalog  5 mg Xylocaine.  All conditions reviewed x-rays reviewed.  Today I did sterile prep injected fifth MPJ 3 mg dexamethasone  Kenalog  5 mg Xylocaine did sterile prep and injected the plantar fascia right 3 mg Kenalog  5 mg Xylocaine advised on supportive shoes reappoint to recheck discussed       X-rays do indicate that there is mild spur formation and I noted no other forefoot pathology no signs of arthritis stress fracture

## 2023-08-19 ENCOUNTER — Other Ambulatory Visit: Payer: Self-pay | Admitting: Family Medicine

## 2023-08-19 DIAGNOSIS — Z1231 Encounter for screening mammogram for malignant neoplasm of breast: Secondary | ICD-10-CM

## 2023-08-24 DIAGNOSIS — R945 Abnormal results of liver function studies: Secondary | ICD-10-CM | POA: Diagnosis not present

## 2023-09-02 ENCOUNTER — Ambulatory Visit: Payer: Medicare PPO | Admitting: Podiatry

## 2023-09-02 DIAGNOSIS — M722 Plantar fascial fibromatosis: Secondary | ICD-10-CM | POA: Diagnosis not present

## 2023-09-02 MED ORDER — TRIAMCINOLONE ACETONIDE 10 MG/ML IJ SUSP
10.0000 mg | Freq: Once | INTRAMUSCULAR | Status: AC
Start: 2023-09-02 — End: 2023-09-02
  Administered 2023-09-02: 10 mg via INTRA_ARTICULAR

## 2023-09-02 NOTE — Progress Notes (Signed)
 Subjective:   Patient ID: Leah Mathews, female   DOB: 73 y.o.   MRN: [id number]   HPI Patient presents stating the left outside foot is doing much better the right heel is still bothering her to a degree   ROS      Objective:  Physical Exam  Neuro vascular status intact significant improvement left fifth MPJ right heel quite sore medial band     Assessment:  Inflammatory fasciitis right heel still present     Plan:  Steroid injection of the right heel accomplished today 3 mg Kenalog  5 mg Xylocaine after sterile prep with sterile dressing applied afterwards

## 2023-09-22 ENCOUNTER — Ambulatory Visit
Admission: RE | Admit: 2023-09-22 | Discharge: 2023-09-22 | Disposition: A | Discharge status: Home / Self Care | Payer: Medicare PPO | Source: Ambulatory Visit | Attending: Family Medicine | Admitting: Family Medicine

## 2023-09-22 DIAGNOSIS — Z1231 Encounter for screening mammogram for malignant neoplasm of breast: Secondary | ICD-10-CM

## 2023-11-02 ENCOUNTER — Ambulatory Visit (INDEPENDENT_AMBULATORY_CARE_PROVIDER_SITE_OTHER): Payer: Medicare PPO | Admitting: Audiology

## 2023-11-02 ENCOUNTER — Institutional Professional Consult (permissible substitution) (INDEPENDENT_AMBULATORY_CARE_PROVIDER_SITE_OTHER): Payer: Medicare PPO

## 2023-11-05 DIAGNOSIS — M25562 Pain in left knee: Secondary | ICD-10-CM | POA: Diagnosis not present

## 2023-11-24 DIAGNOSIS — Z961 Presence of intraocular lens: Secondary | ICD-10-CM | POA: Diagnosis not present

## 2023-11-24 DIAGNOSIS — H524 Presbyopia: Secondary | ICD-10-CM | POA: Diagnosis not present

## 2023-11-25 DIAGNOSIS — L821 Other seborrheic keratosis: Secondary | ICD-10-CM | POA: Diagnosis not present

## 2023-11-25 DIAGNOSIS — D2371 Other benign neoplasm of skin of right lower limb, including hip: Secondary | ICD-10-CM | POA: Diagnosis not present

## 2023-11-25 DIAGNOSIS — Z85828 Personal history of other malignant neoplasm of skin: Secondary | ICD-10-CM | POA: Diagnosis not present

## 2023-11-25 DIAGNOSIS — L590 Erythema ab igne [dermatitis ab igne]: Secondary | ICD-10-CM | POA: Diagnosis not present

## 2023-11-25 DIAGNOSIS — D1800 Hemangioma unspecified site: Secondary | ICD-10-CM | POA: Diagnosis not present

## 2023-11-25 DIAGNOSIS — D225 Melanocytic nevi of trunk: Secondary | ICD-10-CM | POA: Diagnosis not present

## 2023-11-25 DIAGNOSIS — L578 Other skin changes due to chronic exposure to nonionizing radiation: Secondary | ICD-10-CM | POA: Diagnosis not present

## 2023-12-08 DIAGNOSIS — M25562 Pain in left knee: Secondary | ICD-10-CM | POA: Diagnosis not present

## 2023-12-22 DIAGNOSIS — R29898 Other symptoms and signs involving the musculoskeletal system: Secondary | ICD-10-CM | POA: Diagnosis not present

## 2023-12-22 DIAGNOSIS — M25562 Pain in left knee: Secondary | ICD-10-CM | POA: Diagnosis not present

## 2023-12-29 DIAGNOSIS — R29898 Other symptoms and signs involving the musculoskeletal system: Secondary | ICD-10-CM | POA: Diagnosis not present

## 2023-12-29 DIAGNOSIS — M25562 Pain in left knee: Secondary | ICD-10-CM | POA: Diagnosis not present

## 2023-12-31 DIAGNOSIS — R29898 Other symptoms and signs involving the musculoskeletal system: Secondary | ICD-10-CM | POA: Diagnosis not present

## 2023-12-31 DIAGNOSIS — M25562 Pain in left knee: Secondary | ICD-10-CM | POA: Diagnosis not present

## 2024-01-05 DIAGNOSIS — R29898 Other symptoms and signs involving the musculoskeletal system: Secondary | ICD-10-CM | POA: Diagnosis not present

## 2024-01-05 DIAGNOSIS — M25562 Pain in left knee: Secondary | ICD-10-CM | POA: Diagnosis not present

## 2024-01-07 ENCOUNTER — Ambulatory Visit (INDEPENDENT_AMBULATORY_CARE_PROVIDER_SITE_OTHER): Admitting: Audiology

## 2024-01-07 ENCOUNTER — Encounter (INDEPENDENT_AMBULATORY_CARE_PROVIDER_SITE_OTHER): Payer: Self-pay | Admitting: Otolaryngology

## 2024-01-07 ENCOUNTER — Ambulatory Visit (INDEPENDENT_AMBULATORY_CARE_PROVIDER_SITE_OTHER): Admitting: Otolaryngology

## 2024-01-07 VITALS — BP 120/83 | HR 95 | Ht 64.0 in | Wt 158.0 lb

## 2024-01-07 DIAGNOSIS — H8111 Benign paroxysmal vertigo, right ear: Secondary | ICD-10-CM

## 2024-01-07 DIAGNOSIS — M25562 Pain in left knee: Secondary | ICD-10-CM | POA: Diagnosis not present

## 2024-01-07 DIAGNOSIS — H9313 Tinnitus, bilateral: Secondary | ICD-10-CM | POA: Diagnosis not present

## 2024-01-07 DIAGNOSIS — H903 Sensorineural hearing loss, bilateral: Secondary | ICD-10-CM

## 2024-01-07 DIAGNOSIS — R42 Dizziness and giddiness: Secondary | ICD-10-CM

## 2024-01-07 DIAGNOSIS — R29898 Other symptoms and signs involving the musculoskeletal system: Secondary | ICD-10-CM | POA: Diagnosis not present

## 2024-01-09 DIAGNOSIS — H903 Sensorineural hearing loss, bilateral: Secondary | ICD-10-CM | POA: Insufficient documentation

## 2024-01-09 DIAGNOSIS — H8112 Benign paroxysmal vertigo, left ear: Secondary | ICD-10-CM | POA: Insufficient documentation

## 2024-01-09 DIAGNOSIS — H8111 Benign paroxysmal vertigo, right ear: Secondary | ICD-10-CM | POA: Insufficient documentation

## 2024-01-09 DIAGNOSIS — R42 Dizziness and giddiness: Secondary | ICD-10-CM | POA: Insufficient documentation

## 2024-01-09 DIAGNOSIS — H9313 Tinnitus, bilateral: Secondary | ICD-10-CM | POA: Insufficient documentation

## 2024-01-09 NOTE — Progress Notes (Signed)
 CC: Recurrent dizziness  HPI:  Leah Mathews is a 73 y.o. female who presents today complaining of recurrent dizziness for 25+ years.  She describes her dizziness as a spinning vertigo that lasts for several seconds.  The dizziness is often triggered with sudden head movement and when she sits up abruptly.  She is symptomatic on a daily basis.  She was previously seen by a physical therapist.  However attempts to treat her dizziness were unsuccessful.  The patient has a history of bilateral hearing loss and tinnitus.  She has been wearing bilateral hearing aids for 20+ years.  She has no previous ENT surgery.  Currently she denies any otalgia, otorrhea, or vertigo.  Past Medical History:  Diagnosis Date   Asthma    Chronic back pain    Depression    Epilepsy (HCC)    childhood   Neuralgia    Peripheral neuropathic pain     Past Surgical History:  Procedure Laterality Date   cataract surgery Left 2017   COLONOSCOPY     ECTOPIC PREGNANCY SURGERY  1982   wisdom teeth removal      Family History  Problem Relation Age of Onset   Congestive Heart Failure Mother    Heart disease Father    COPD Sister     Social History:  reports that she has never smoked. She has never used smokeless tobacco. She reports current alcohol use. She reports that she does not use drugs.  Allergies:  Allergies  Allergen Reactions   Ciprofloxacin Rash   Sulfa Antibiotics Rash   Tetanus Toxoid Rash   Amitriptyline Other (See Comments)    Depression   Ibuprofen    Tizanidine Hcl     Other reaction(s): hallucinations   Ciprofloxacin Rash   Elemental Sulfur Rash   Tetanus Toxoids Rash    Prior to Admission medications   Medication Sig Start Date End Date Taking? Authorizing Provider  acetaminophen (TYLENOL) 500 MG tablet Take 500 mg by mouth every 6 (six) hours as needed.   Yes [provider]  albuterol (VENTOLIN HFA) 108 (90 Base) MCG/ACT inhaler Inhale 2 puffs into the lungs every 6  (six) hours as needed for wheezing or shortness of breath.   Yes [provider]  ALPRAZolam (XANAX) 0.25 MG tablet Take 0.25 mg by mouth daily as needed for anxiety (uses 1-2 times a year).   Yes [provider]  Calcium Carb-Cholecalciferol (CALCIUM 600 + D PO) Take 1 tablet by mouth daily.   Yes [provider]  carboxymethylcellulose (REFRESH PLUS) 0.5 % SOLN 1 drop 3 (three) times daily as needed.   Yes [provider]  Cyanocobalamin  (VITAMIN B12) 1000 MCG TBCR Take 1 tablet by mouth daily.   Yes [provider]  Homeopathic Products [facility] JOINT RELIEF ROLL-ON EX) Apply topically.   Yes [provider]  Magnesium 250 MG TABS Take 1 tablet by mouth daily.   Yes [provider]  Omega-3 Fatty Acids (FISH OIL) 1200 MG CAPS Take 1,000 mg by mouth daily.   Yes [provider]  omeprazole (PRILOSEC OTC) 20 MG tablet Take 20 mg by mouth daily.   Yes [provider]  VITAMIN D PO Take 2,000 Units by mouth daily.   Yes [provider]    Blood pressure 120/83, pulse 95, height 5' 4 (1.626 m), weight 158 lb (71.7 kg), SpO2 95%. Exam: General: Communicates without difficulty, well nourished, no acute distress. Head: Normocephalic, no evidence injury, no  tenderness, facial buttresses intact without stepoff. Face/sinus: No tenderness to palpation and percussion. Facial movement is normal and symmetric. Eyes: PERRL, EOMI. No scleral icterus, conjunctivae clear. Neuro: CN II exam reveals vision grossly intact.  No nystagmus at any point of gaze. Ears: Auricles well formed without lesions.  Ear canals are intact without mass or lesion.  No erythema or edema is appreciated.  The TMs are intact without fluid. Nose: External evaluation reveals normal support and skin without lesions.  Dorsum is intact.  Anterior rhinoscopy reveals normal mucosa over anterior aspect of inferior turbinates and intact septum.  No purulence  noted. Oral:  Oral cavity and oropharynx are intact, symmetric, without erythema or edema.  Mucosa is moist without lesions. Neck: Full range of motion without pain.  There is no significant lymphadenopathy.  No masses palpable.  Thyroid bed within normal limits to palpation.  Parotid glands and submandibular glands equal bilaterally without mass.  Trachea is midline. Neuro:  CN 2-12 grossly intact. Vestibular: No nystagmus at any point of gaze. Vestibular: Dix-Hallpike produces rotational nystagmus with right-sided positioning. Vestibular: There is no nystagmus with pneumatic pressure on either tympanic membrane or Valsalva. The cerebellar examination is unremarkable.    Procedure: Right-sided Epley maneuver  Anesthesia: None Indication: To treat the right BPPV Desciption:  The patient is first placed in a right-sided Dix-Hallpike position. After the vertigo has subsided, the head is gradually rotated from the right to the left, completing a 180 turn. The patient is then returned to the upright position. The patient tolerated the procedure well without any difficulty.    Assessment: 1.  Recurrent dizziness, secondary to right benign paroxysmal positional vertigo.  Her right Dix-Hallpike maneuver is positive. 2.  The patient's ear canals, tympanic membranes, and middle ear spaces are normal.  No middle ear effusion is noted. 3.  Subjectively stable bilateral sensorineural hearing loss and tinnitus.  Plan: 1.  The physical exam findings are reviewed with the patient. 2.  The pathophysiology of dizziness and BPPV are extensively discussed with the patient.  Questions were invited and answered. 3.  The right Epley maneuver is performed today without difficulty. 4.  Post Epley activity restrictions are reviewed with the patient. 5.  Continue the use of her hearing aids.  6.  The patient will return for reevaluation in 4 weeks.  Daneille Desilva W Harris Kistler 01/09/2024, 9:48 PM

## 2024-01-12 DIAGNOSIS — M25562 Pain in left knee: Secondary | ICD-10-CM | POA: Diagnosis not present

## 2024-01-12 DIAGNOSIS — R29898 Other symptoms and signs involving the musculoskeletal system: Secondary | ICD-10-CM | POA: Diagnosis not present

## 2024-01-14 DIAGNOSIS — M25562 Pain in left knee: Secondary | ICD-10-CM | POA: Diagnosis not present

## 2024-01-15 DIAGNOSIS — M25562 Pain in left knee: Secondary | ICD-10-CM | POA: Diagnosis not present

## 2024-01-15 DIAGNOSIS — R29898 Other symptoms and signs involving the musculoskeletal system: Secondary | ICD-10-CM | POA: Diagnosis not present

## 2024-01-19 DIAGNOSIS — M25562 Pain in left knee: Secondary | ICD-10-CM | POA: Diagnosis not present

## 2024-01-19 DIAGNOSIS — R29898 Other symptoms and signs involving the musculoskeletal system: Secondary | ICD-10-CM | POA: Diagnosis not present

## 2024-01-21 DIAGNOSIS — M25562 Pain in left knee: Secondary | ICD-10-CM | POA: Diagnosis not present

## 2024-01-21 DIAGNOSIS — R29898 Other symptoms and signs involving the musculoskeletal system: Secondary | ICD-10-CM | POA: Diagnosis not present

## 2024-01-27 DIAGNOSIS — R29898 Other symptoms and signs involving the musculoskeletal system: Secondary | ICD-10-CM | POA: Diagnosis not present

## 2024-01-27 DIAGNOSIS — M25562 Pain in left knee: Secondary | ICD-10-CM | POA: Diagnosis not present

## 2024-02-09 ENCOUNTER — Encounter (INDEPENDENT_AMBULATORY_CARE_PROVIDER_SITE_OTHER): Payer: Self-pay | Admitting: Otolaryngology

## 2024-02-09 ENCOUNTER — Ambulatory Visit (INDEPENDENT_AMBULATORY_CARE_PROVIDER_SITE_OTHER): Admitting: Otolaryngology

## 2024-02-09 VITALS — BP 134/79 | HR 90

## 2024-02-09 DIAGNOSIS — H9313 Tinnitus, bilateral: Secondary | ICD-10-CM

## 2024-02-09 DIAGNOSIS — H8111 Benign paroxysmal vertigo, right ear: Secondary | ICD-10-CM | POA: Diagnosis not present

## 2024-02-09 DIAGNOSIS — R42 Dizziness and giddiness: Secondary | ICD-10-CM

## 2024-02-09 DIAGNOSIS — H903 Sensorineural hearing loss, bilateral: Secondary | ICD-10-CM

## 2024-02-10 NOTE — Progress Notes (Signed)
 Patient ID: Leah Mathews, female   DOB: September 08, 1950, 73 y.o.   MRN: [id number]  Follow-up: Recurrent dizziness  HPI: The patient is a 73 year old female who returns today for her follow-up evaluation.  She was last seen in June 2025.  At that time, she was complaining of recurrent dizziness for 25+ years.  Her right Dix-Hallpike maneuver was positive, consistent with right benign paroxysmal positional vertigo.  She was treated with a right Epley maneuver.  The patient returns today complaining of more recurrent dizziness.  She is having intermittent spinning vertigo the last for several seconds.  It is often triggered with sudden head movement.  The patient has a history of bilateral sensorineural hearing loss and tinnitus.  She has not noted any significant change recently.  Exam: General: Communicates without difficulty, well nourished, no acute distress. Head: Normocephalic, no evidence injury, no tenderness, facial buttresses intact without stepoff. Face/sinus: No tenderness to palpation and percussion. Facial movement is normal and symmetric. Eyes: PERRL, EOMI. No scleral icterus, conjunctivae clear. Neuro: CN II exam reveals vision grossly intact.  No nystagmus at any point of gaze. Ears: Auricles well formed without lesions.  Ear canals are intact without mass or lesion.  No erythema or edema is appreciated.  The TMs are intact without fluid. Nose: External evaluation reveals normal support and skin without lesions.  Dorsum is intact.  Anterior rhinoscopy reveals normal mucosa over anterior aspect of inferior turbinates and intact septum.  No purulence noted. Oral:  Oral cavity and oropharynx are intact, symmetric, without erythema or edema.  Mucosa is moist without lesions. Neck: Full range of motion without pain.  There is no significant lymphadenopathy.  No masses palpable.  Thyroid bed within normal limits to palpation.  Parotid glands and submandibular glands equal bilaterally without mass.   Trachea is midline. Neuro:  CN 2-12 grossly intact. Vestibular: No nystagmus at any point of gaze. Vestibular: Dix-Hallpike produces rotational nystagmus with right-sided positioning. Vestibular: There is no nystagmus with pneumatic pressure on either tympanic membrane or Valsalva. The cerebellar examination is unremarkable.     Procedure: Right-sided Epley maneuver  Anesthesia: None Indication: To treat the right BPPV Desciption:  The patient is first placed in a right-sided Dix-Hallpike position. After the vertigo has subsided, the head is gradually rotated from the right to the left, completing a 180 turn. The patient is then returned to the upright position. The patient tolerated the procedure well without any difficulty.     Assessment: 1.  The patient's recurrent dizziness is likely secondary to persistent right benign paroxysmal positional vertigo. 2.  Her right Dix-Hallpike maneuver is positive today. 3.  Her ear canals, tympanic membranes, and middle ear spaces are normal.  No infection is noted today. 4.  Subjectively stable bilateral sensorineural hearing loss and tinnitus.  Plan: 1.  The physical exam findings are reviewed with the patient. 2.  The pathophysiology of BPPV is reviewed with the patient. 3.  The right Epley maneuver is performed without difficulty. 4.  The post Epley activity restrictions are reviewed. 5.  Continue the use of her hearing aids. 6.  The patient will return for reevaluation in 4 weeks.

## 2024-02-15 DIAGNOSIS — M79674 Pain in right toe(s): Secondary | ICD-10-CM | POA: Diagnosis not present

## 2024-02-15 DIAGNOSIS — J4520 Mild intermittent asthma, uncomplicated: Secondary | ICD-10-CM | POA: Diagnosis not present

## 2024-02-15 DIAGNOSIS — R945 Abnormal results of liver function studies: Secondary | ICD-10-CM | POA: Diagnosis not present

## 2024-02-15 DIAGNOSIS — E538 Deficiency of other specified B group vitamins: Secondary | ICD-10-CM | POA: Diagnosis not present

## 2024-02-15 DIAGNOSIS — F321 Major depressive disorder, single episode, moderate: Secondary | ICD-10-CM | POA: Diagnosis not present

## 2024-03-09 ENCOUNTER — Ambulatory Visit (INDEPENDENT_AMBULATORY_CARE_PROVIDER_SITE_OTHER): Admitting: Otolaryngology

## 2024-03-09 ENCOUNTER — Encounter (INDEPENDENT_AMBULATORY_CARE_PROVIDER_SITE_OTHER): Payer: Self-pay | Admitting: Otolaryngology

## 2024-03-09 VITALS — BP 144/87 | HR 82

## 2024-03-09 DIAGNOSIS — H8111 Benign paroxysmal vertigo, right ear: Secondary | ICD-10-CM

## 2024-03-09 DIAGNOSIS — R42 Dizziness and giddiness: Secondary | ICD-10-CM

## 2024-03-09 DIAGNOSIS — H903 Sensorineural hearing loss, bilateral: Secondary | ICD-10-CM | POA: Diagnosis not present

## 2024-03-11 NOTE — Progress Notes (Signed)
 Patient ID: Leah Mathews, female   DOB: 25-Apr-1951, 73 y.o.   MRN: [id number]  Follow-up: Recurrent dizziness, hearing loss  HPI: The patient is a 73 year old female who returns today for her follow-up evaluation.  She was last seen in July 2025.  At that time, she was complaining of recurrent dizziness.  Her right Dix-Hallpike maneuver was positive.  She was treated with right-sided Epley maneuver.  The patient returns today reporting no more dizziness.  She has not had any spinning sensation.  The patient also has a history of bilateral sensorineural hearing loss.  She reports no change in her hearing.  She is currently wearing bilateral hearing aids.  Exam: General: Communicates without difficulty, well nourished, no acute distress. Head: Normocephalic, no evidence injury, no tenderness, facial buttresses intact without stepoff. Face/sinus: No tenderness to palpation and percussion. Facial movement is normal and symmetric. Eyes: PERRL, EOMI. No scleral icterus, conjunctivae clear. Neuro: CN II exam reveals vision grossly intact.  No nystagmus at any point of gaze. Ears: Auricles well formed without lesions.  Ear canals are intact without mass or lesion.  No erythema or edema is appreciated.  The TMs are intact without fluid. Nose: External evaluation reveals normal support and skin without lesions.  Dorsum is intact.  Anterior rhinoscopy reveals normal mucosa over anterior aspect of inferior turbinates and intact septum.  No purulence noted. Oral:  Oral cavity and oropharynx are intact, symmetric, without erythema or edema.  Mucosa is moist without lesions. Neck: Full range of motion without pain.  There is no significant lymphadenopathy.  No masses palpable.  Thyroid bed within normal limits to palpation.  Parotid glands and submandibular glands equal bilaterally without mass.  Trachea is midline. Neuro:  CN 2-12 grossly intact. Vestibular: No nystagmus at any point of gaze. Dix Hallpike negative.   Vestibular: There is no nystagmus with pneumatic pressure on either tympanic membrane or Valsalva. The cerebellar examination is unremarkable.   Assessment: 1.  The patient's recurrent dizziness/right BPPV are currently under control. 2.  Her ear canals, tympanic membranes, and middle ear spaces are all normal. 3.  Subjectively stable bilateral sensorineural hearing loss.  Plan: 1.  The physical exam findings are reviewed with the patient. 2.  Continue the use of her hearing aids. 3.  The pathophysiology of BPPV is reviewed with the patient. 4.  The patient is encouraged to call for any questions or concerns.

## 2024-04-15 DIAGNOSIS — Z23 Encounter for immunization: Secondary | ICD-10-CM | POA: Diagnosis not present

## 2024-05-16 DIAGNOSIS — H903 Sensorineural hearing loss, bilateral: Secondary | ICD-10-CM | POA: Diagnosis not present

## 2024-05-19 ENCOUNTER — Encounter (INDEPENDENT_AMBULATORY_CARE_PROVIDER_SITE_OTHER): Payer: Self-pay | Admitting: Otolaryngology

## 2024-05-19 ENCOUNTER — Ambulatory Visit (INDEPENDENT_AMBULATORY_CARE_PROVIDER_SITE_OTHER): Admitting: Otolaryngology

## 2024-05-19 VITALS — BP 133/83 | HR 98 | Temp 97.9°F | Ht 64.0 in | Wt 155.0 lb

## 2024-05-19 DIAGNOSIS — H8112 Benign paroxysmal vertigo, left ear: Secondary | ICD-10-CM | POA: Diagnosis not present

## 2024-05-19 DIAGNOSIS — H903 Sensorineural hearing loss, bilateral: Secondary | ICD-10-CM

## 2024-05-19 DIAGNOSIS — R42 Dizziness and giddiness: Secondary | ICD-10-CM

## 2024-05-19 NOTE — Progress Notes (Signed)
 Patient ID: Leah Mathews, female   DOB: [birth year], 73 y.o.   MRN: [id number]  Follow-up: Recurrent dizziness, hearing loss   HPI: The patient is a 73 year old female who returns today complaining of recurrent dizziness.  The patient has a history of bilateral sensorineural hearing loss and recurrent benign paroxysmal positional vertigo.  In July 2025, her right Dix-Hallpike maneuver was positive.  She was successfully treated with the right Epley maneuver.  According to the patient, she was doing well until 2 weeks ago, when she started experiencing more recurrent dizziness.  She noted a spinning vertigo that lasted for several seconds, when she turned to her left while she was in bed.  She denies any recent change in her hearing.  She was fitted with bilateral new hearing aids earlier this week.  Currently she denies any otalgia or otorrhea.  Exam: General: Communicates without difficulty, well nourished, no acute distress. Head: Normocephalic, no evidence injury, no tenderness, facial buttresses intact without stepoff. Face/sinus: No tenderness to palpation and percussion. Facial movement is normal and symmetric. Eyes: PERRL, EOMI. No scleral icterus, conjunctivae clear. Neuro: CN II exam reveals vision grossly intact.  No nystagmus at any point of gaze. Ears: Auricles well formed without lesions.  Ear canals are intact without mass or lesion.  No erythema or edema is appreciated.  The TMs are intact without fluid. Nose: External evaluation reveals normal support and skin without lesions.  Dorsum is intact.  Anterior rhinoscopy reveals normal mucosa over anterior aspect of inferior turbinates and intact septum.  No purulence noted. Oral:  Oral cavity and oropharynx are intact, symmetric, without erythema or edema.  Mucosa is moist without lesions. Neck: Full range of motion without pain.  There is no significant lymphadenopathy.  No masses palpable.  Thyroid bed within normal limits to palpation.  Parotid  glands and submandibular glands equal bilaterally without mass.  Trachea is midline. Neuro:  CN 2-12 grossly intact. Vestibular: No nystagmus at any point of gaze. Vestibular: Dix-Hallpike produces rotational nystagmus with left-sided positioning. Vestibular: There is no nystagmus with pneumatic pressure on either tympanic membrane or Valsalva. The cerebellar examination is unremarkable.   Procedure: Left-sided Epley maneuver  Anesthesia: None Indication: To treat the left BPPV Desciption:  The patient is first placed in a left-sided Dix-Hallpike position. After the vertigo has subsided, the head is gradually rotated from the left to the right, completing a 180 turn. The patient is then returned to the upright position. The patient tolerated the procedure well without any difficulty.    Assessment: 1.  Recurrent dizziness, secondary to left benign paroxysmal positional vertigo.  She was previously treated for right BPPV. 2.  Subjectively stable bilateral sensorineural hearing loss. 3.  Her ear canals, tympanic membranes, and middle ear spaces are all normal.  Her left Dix-Hallpike maneuver is positive.  Plan: 1.  The physical exam findings are reviewed with the patient. 2.  The pathophysiology of dizziness and BPPV are discussed with the patient.  Questions are invited and answered. 3.  The left Epley maneuver is performed today without difficulty. 4.  Post Epley activity restrictions are discussed with the patient. 5.  The patient will return for reevaluation in 1 month. 6.  Continue the use of her hearing aids.

## 2024-06-24 ENCOUNTER — Ambulatory Visit (INDEPENDENT_AMBULATORY_CARE_PROVIDER_SITE_OTHER): Admitting: Otolaryngology

## 2024-06-24 ENCOUNTER — Encounter (INDEPENDENT_AMBULATORY_CARE_PROVIDER_SITE_OTHER): Payer: Self-pay | Admitting: Otolaryngology

## 2024-06-24 VITALS — BP 137/89 | HR 85 | Temp 98.1°F

## 2024-06-24 DIAGNOSIS — H8111 Benign paroxysmal vertigo, right ear: Secondary | ICD-10-CM

## 2024-06-24 DIAGNOSIS — H903 Sensorineural hearing loss, bilateral: Secondary | ICD-10-CM

## 2024-06-24 DIAGNOSIS — R42 Dizziness and giddiness: Secondary | ICD-10-CM

## 2024-06-25 NOTE — Progress Notes (Signed)
 Patient ID: Leah Mathews, female   DOB: 17-Sep-1950, 73 y.o.   MRN: [id number]  Follow up: Recurrent dizziness, bilateral hearing loss  History of Present Illness Leah Mathews is a 73 year old female who returns today complaining of more recurrent dizziness.  She was also seen for her bilateral sensorineural hearing loss.  She experiences recurrent dizziness over the past month.  The dizziness is triggered when she turns to the right, particularly when turning over in bed, with the last episode occurring yesterday.  Her hearing remains unchanged, and she uses hearing aids bilaterally for sensorineural hearing loss. There is no reported change in her hearing status since the last visit.  One month ago, she underwent treatment for BPPV on the left side. Currently, she is experiencing symptoms on the right side.  Exam: General: Communicates without difficulty, well nourished, no acute distress. Head: Normocephalic, no evidence injury, no tenderness, facial buttresses intact without stepoff. Face/sinus: No tenderness to palpation and percussion. Facial movement is normal and symmetric. Eyes: PERRL, EOMI. No scleral icterus, conjunctivae clear. Neuro: CN II exam reveals vision grossly intact.  No nystagmus at any point of gaze. Ears: Auricles well formed without lesions.  Ear canals are intact without mass or lesion.  No erythema or edema is appreciated.  The TMs are intact without fluid. Nose: External evaluation reveals normal support and skin without lesions.  Dorsum is intact.  Anterior rhinoscopy reveals normal mucosa over anterior aspect of inferior turbinates and intact septum.  No purulence noted. Oral:  Oral cavity and oropharynx are intact, symmetric, without erythema or edema.  Mucosa is moist without lesions. Neck: Full range of motion without pain.  There is no significant lymphadenopathy.  No masses palpable.  Thyroid bed within normal limits to palpation.  Parotid glands and submandibular  glands equal bilaterally without mass.  Trachea is midline. Neuro:  CN 2-12 grossly intact. Vestibular: No nystagmus at any point of gaze. Vestibular: Dix-Hallpike produces rotational nystagmus with right-sided positioning. Vestibular: There is no nystagmus with pneumatic pressure on either tympanic membrane or Valsalva. The cerebellar examination is unremarkable.    Procedure: Right-sided Epley maneuver  Anesthesia: None Indication: To treat the right BPPV Desciption:  The patient is first placed in a right-sided Dix-Hallpike position. After the vertigo has subsided, the head is gradually rotated from the right to the left, completing a 180 turn. The patient is then returned to the upright position. The patient tolerated the procedure well without any difficulty.   Assessment & Plan Recurrent dizziness, secondary to right benign paroxysmal positional vertigo (BPPV) Recurrent right-sided BPPV with spinning dizziness upon turning to the right, confirmed by positive Dix-Hallpike maneuver. Previous left-sided BPPV resolved one month ago.  - The pathophysiology of dizziness and BPPV are discussed with the patient. - The right Epley maneuver is performed today without difficulty. - Advised to avoid head shaking, jumping, and sleeping flat for 48 hours. - Recommended sleeping with head elevated using three pillows or a recliner for two nights. - Scheduled follow-up appointment in two months.  Stable bilateral high-frequency sensorineural hearing loss. - Continue the use of her bilateral hearing aids.

## 2024-07-05 DIAGNOSIS — R519 Headache, unspecified: Secondary | ICD-10-CM | POA: Diagnosis not present

## 2024-07-06 ENCOUNTER — Ambulatory Visit: Admitting: Podiatry

## 2024-07-06 ENCOUNTER — Ambulatory Visit (INDEPENDENT_AMBULATORY_CARE_PROVIDER_SITE_OTHER)

## 2024-07-06 ENCOUNTER — Encounter: Payer: Self-pay | Admitting: Podiatry

## 2024-07-06 DIAGNOSIS — M7751 Other enthesopathy of right foot: Secondary | ICD-10-CM | POA: Diagnosis not present

## 2024-07-06 DIAGNOSIS — L84 Corns and callosities: Secondary | ICD-10-CM | POA: Diagnosis not present

## 2024-07-06 DIAGNOSIS — M722 Plantar fascial fibromatosis: Secondary | ICD-10-CM

## 2024-07-06 MED ORDER — TRIAMCINOLONE ACETONIDE 10 MG/ML IJ SUSP: 10.0000 mg | Freq: Once | INTRAMUSCULAR | Status: DC

## 2024-07-07 DIAGNOSIS — M7751 Other enthesopathy of right foot: Secondary | ICD-10-CM | POA: Diagnosis not present

## 2024-07-07 MED ORDER — TRIAMCINOLONE ACETONIDE 10 MG/ML IJ SUSP
10.0000 mg | Freq: Once | INTRAMUSCULAR | Status: AC
  Administered 2024-07-07: 10 mg via INTRA_ARTICULAR

## 2024-07-07 NOTE — Progress Notes (Signed)
 Subjective:   Patient ID: Leah Mathews, female   DOB: 73 y.o.   MRN: [id number]   HPI Patient presents with a lot of pain around the big toe joint right and also has an inflamed second digit right that has been very tender with corn callus formation with patient having used corn remover   ROS      Objective:  Physical Exam  Neurovascular status intact with quite a bit of inflammation around the first MPJ right with reduced range of motion and fluid around the joint surface and pain when palpated along with a keratotic lesion on the medial side of digit 2 right that is painful when pressed     Assessment:  Inflammatory capsulitis of the first MPJ right fluid buildup noted with inflamed capsule with keratotic tissue second digit right medial side     Plan:  H&P reviewed all conditions.  I want to try to understand better where her pain is coming from I did a careful injection around the first MPJ 3 mg dexamethasone  Kenalog  5 mg Xylocaine applied sterile dressing and then for the second digit I did do a small courtesy bursal injection and then debrided the lesion carefully no iatrogenic bleeding apply dressing and I want to see back in 6 weeks.  This may be a surgical issue depending on the response  X-rays indicate spurring with narrowing of the first MPJ joint right with pressure between the hallux and second toe

## 2024-08-17 ENCOUNTER — Ambulatory Visit: Admitting: Podiatry

## 2024-08-17 ENCOUNTER — Encounter: Payer: Self-pay | Admitting: Podiatry

## 2024-08-17 DIAGNOSIS — M2041 Other hammer toe(s) (acquired), right foot: Secondary | ICD-10-CM

## 2024-08-17 DIAGNOSIS — D169 Benign neoplasm of bone and articular cartilage, unspecified: Secondary | ICD-10-CM | POA: Diagnosis not present

## 2024-08-17 DIAGNOSIS — M205X1 Other deformities of toe(s) (acquired), right foot: Secondary | ICD-10-CM

## 2024-08-17 NOTE — Progress Notes (Signed)
 Subjective:   Patient ID: Leah Mathews, female   DOB: 74 y.o.   MRN: [id number]   HPI The patient states she is having a lot of pain again in the big toe joint right and especially between the big toe and second toe with keratotic lesion second toe and spur on the big toe.  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  We did discuss surgery and I allowed her to look at a consent form and she does most likely want to get this done but she is going to hold off and then she can schedule with our scheduler and then I will review again these and at that point we will book for surgery with all questions answered today for recovery of the procedure

## 2024-08-17 NOTE — Patient Instructions (Signed)

## 2024-08-19 ENCOUNTER — Other Ambulatory Visit: Payer: Self-pay | Admitting: Family Medicine

## 2024-08-19 DIAGNOSIS — R10817 Generalized abdominal tenderness: Secondary | ICD-10-CM

## 2024-08-25 ENCOUNTER — Other Ambulatory Visit: Payer: Self-pay | Admitting: Family Medicine

## 2024-08-25 DIAGNOSIS — Z1231 Encounter for screening mammogram for malignant neoplasm of breast: Secondary | ICD-10-CM

## 2024-08-26 ENCOUNTER — Ambulatory Visit
Admission: RE | Admit: 2024-08-26 | Discharge: 2024-08-26 | Disposition: A | Source: Ambulatory Visit | Attending: Family Medicine | Admitting: Family Medicine

## 2024-08-26 ENCOUNTER — Ambulatory Visit (INDEPENDENT_AMBULATORY_CARE_PROVIDER_SITE_OTHER): Admitting: Otolaryngology

## 2024-08-26 DIAGNOSIS — R10817 Generalized abdominal tenderness: Secondary | ICD-10-CM

## 2024-08-26 MED ORDER — IOPAMIDOL (ISOVUE-300) INJECTION 61%
200.0000 mL | Freq: Once | INTRAVENOUS | Status: AC | PRN
  Administered 2024-08-26: 100 mL via INTRAVENOUS

## 2024-08-30 ENCOUNTER — Other Ambulatory Visit

## 2024-09-01 ENCOUNTER — Encounter (INDEPENDENT_AMBULATORY_CARE_PROVIDER_SITE_OTHER): Payer: Self-pay | Admitting: Otolaryngology

## 2024-09-01 ENCOUNTER — Ambulatory Visit (INDEPENDENT_AMBULATORY_CARE_PROVIDER_SITE_OTHER): Admitting: Otolaryngology

## 2024-09-01 VITALS — BP 133/77 | HR 100

## 2024-09-01 DIAGNOSIS — H8111 Benign paroxysmal vertigo, right ear: Secondary | ICD-10-CM

## 2024-09-01 DIAGNOSIS — H903 Sensorineural hearing loss, bilateral: Secondary | ICD-10-CM

## 2024-09-01 NOTE — Progress Notes (Signed)
 Patient ID: Leah Mathews, female   DOB: 1950-09-01, 74 y.o.   MRN: [id number]  Follow up: Recurrent dizziness, right BPPV, bilateral hearing loss  History of Present Illness Leah Mathews is a 74 year old female with recurrent benign paroxysmal positional vertigo and bilateral sensorineural hearing loss who presents for evaluation of a recent vertigo episode and ongoing hearing concerns.  In mid-December 2025, she experienced a severe episode of vertigo after bending over, described as intense spinning lasting approximately twenty seconds, followed by inability to ambulate for two to two and a half minutes. During this episode, her body veered to the right, requiring support from her walking stick and her friend. The dizziness gradually resolved over the following days.  Later that day, she had another episode of vertigo when lying down and turning over in bed, characterized by spinning that was more intense and prolonged than her typical episodes. She refrained from activity for the remainder of the day and noted improvement the next day, with continued resolution over several days. She has a history of similar episodes and previously underwent the Epley maneuver. She currently denies ongoing vertigo with positional changes and is able to perform regular activities without recurrence of spinning sensations.  She reports difficulty hearing, though she is uncertain if this represents a significant change compared to six months prior. She is currently adjusting to new hearing aids and is dissatisfied with their performance, noting difficulty hearing conversations even while wearing them. She is awaiting new molds for her hearing aids from her hearing specialist.   Exam: General: Communicates without difficulty, well nourished, no acute distress. Head: Normocephalic, no evidence injury, no tenderness, facial buttresses intact without stepoff. Face/sinus: No tenderness to palpation and percussion.  Facial movement is normal and symmetric. Eyes: PERRL, EOMI. No scleral icterus, conjunctivae clear. Neuro: CN II exam reveals vision grossly intact.  No nystagmus at any point of gaze. Ears: Auricles well formed without lesions.  Ear canals are intact without mass or lesion.  No erythema or edema is appreciated.  The TMs are intact without fluid. Nose: External evaluation reveals normal support and skin without lesions.  Dorsum is intact.  Anterior rhinoscopy reveals congested mucosa over anterior aspect of inferior turbinates and intact septum.  No purulence noted. Oral:  Oral cavity and oropharynx are intact, symmetric, without erythema or edema.  Mucosa is moist without lesions. Neck: Full range of motion without pain.  There is no significant lymphadenopathy.  No masses palpable.  Thyroid bed within normal limits to palpation.  Parotid glands and submandibular glands equal bilaterally without mass.  Trachea is midline. Neuro:  CN 2-12 grossly intact.  Vestibular: No nystagmus at any point of gaze. Dix Hallpike negative. Vestibular: There is no nystagmus with pneumatic pressure on either tympanic membrane or Valsalva. The cerebellar examination is unremarkable.   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 Currently asymptomatic, with no positional vertigo or nystagmus on provocative testing. BPPV is in remission. - Performed Dix-Hallpike testing in clinic, which was negative for vertigo or nystagmus. - Advised that BPPV is transient and may recur unpredictably. - Reassured that vertigo episodes are typically temporary and treatable, and the Epley maneuver remains an effective intervention if symptoms recur. - Recommended avoidance of activities involving bending over or rapid head movements that may precipitate vertigo. - Encouraged resumption of  regular activities as tolerated, avoiding  provocative positions.  Bilateral sensorineural hearing loss - Continue to use of her hearing aids.

## 2024-09-27 ENCOUNTER — Ambulatory Visit
